# Patient Record
Sex: Female | Born: 1985
Health system: Southern US, Community
[De-identification: ages and names within clinical notes are randomized; demographics above are authoritative.]

## PROBLEM LIST (undated history)

## (undated) ENCOUNTER — Inpatient Hospital Stay (HOSPITAL_COMMUNITY): Payer: Self-pay

## (undated) DIAGNOSIS — F32A Depression, unspecified: Secondary | ICD-10-CM

## (undated) DIAGNOSIS — Z8619 Personal history of other infectious and parasitic diseases: Secondary | ICD-10-CM

## (undated) DIAGNOSIS — F909 Attention-deficit hyperactivity disorder, unspecified type: Secondary | ICD-10-CM

## (undated) DIAGNOSIS — M1990 Unspecified osteoarthritis, unspecified site: Secondary | ICD-10-CM

## (undated) DIAGNOSIS — M5430 Sciatica, unspecified side: Secondary | ICD-10-CM

## (undated) DIAGNOSIS — O039 Complete or unspecified spontaneous abortion without complication: Secondary | ICD-10-CM

## (undated) DIAGNOSIS — T560X1A Toxic effect of lead and its compounds, accidental (unintentional), initial encounter: Secondary | ICD-10-CM

## (undated) DIAGNOSIS — R87629 Unspecified abnormal cytological findings in specimens from vagina: Secondary | ICD-10-CM

## (undated) HISTORY — DX: Attention-deficit hyperactivity disorder, unspecified type: F90.9

## (undated) HISTORY — PX: CERVICAL BIOPSY: SHX590

## (undated) HISTORY — DX: Personal history of other infectious and parasitic diseases: Z86.19

## (undated) HISTORY — PX: COLPOSCOPY: SHX161

## (undated) HISTORY — DX: Depression, unspecified: F32.A

## (undated) HISTORY — PX: WISDOM TOOTH EXTRACTION: SHX21

---

## 2017-01-04 ENCOUNTER — Other Ambulatory Visit: Payer: Self-pay

## 2017-01-04 ENCOUNTER — Emergency Department (HOSPITAL_COMMUNITY): Payer: Medicaid Other

## 2017-01-04 ENCOUNTER — Encounter (HOSPITAL_COMMUNITY): Payer: Self-pay | Admitting: Emergency Medicine

## 2017-01-04 ENCOUNTER — Emergency Department (HOSPITAL_COMMUNITY)
Admission: EM | Admit: 2017-01-04 | Discharge: 2017-01-04 | Discharge status: Against Medical Advice | Payer: Medicaid Other | Attending: Emergency Medicine | Admitting: Emergency Medicine

## 2017-01-04 DIAGNOSIS — F17210 Nicotine dependence, cigarettes, uncomplicated: Secondary | ICD-10-CM | POA: Diagnosis not present

## 2017-01-04 DIAGNOSIS — R1013 Epigastric pain: Secondary | ICD-10-CM | POA: Diagnosis present

## 2017-01-04 DIAGNOSIS — R112 Nausea with vomiting, unspecified: Secondary | ICD-10-CM | POA: Insufficient documentation

## 2017-01-04 DIAGNOSIS — Z3A01 Less than 8 weeks gestation of pregnancy: Secondary | ICD-10-CM | POA: Insufficient documentation

## 2017-01-04 DIAGNOSIS — O9989 Other specified diseases and conditions complicating pregnancy, childbirth and the puerperium: Secondary | ICD-10-CM | POA: Insufficient documentation

## 2017-01-04 DIAGNOSIS — O99331 Smoking (tobacco) complicating pregnancy, first trimester: Secondary | ICD-10-CM | POA: Insufficient documentation

## 2017-01-04 HISTORY — DX: Complete or unspecified spontaneous abortion without complication: O03.9

## 2017-01-04 LAB — CBC
HCT: 38.6 % (ref 36.0–46.0)
Hemoglobin: 13.3 g/dL (ref 12.0–15.0)
MCH: 31.3 pg (ref 26.0–34.0)
MCHC: 34.5 g/dL (ref 30.0–36.0)
MCV: 90.8 fL (ref 78.0–100.0)
Platelets: 272 10*3/uL (ref 150–400)
RBC: 4.25 MIL/uL (ref 3.87–5.11)
RDW: 12.7 % (ref 11.5–15.5)
WBC: 12.6 10*3/uL — ABNORMAL HIGH (ref 4.0–10.5)

## 2017-01-04 LAB — COMPREHENSIVE METABOLIC PANEL
ALT: 33 U/L (ref 14–54)
AST: 28 U/L (ref 15–41)
Albumin: 4.1 g/dL (ref 3.5–5.0)
Alkaline Phosphatase: 64 U/L (ref 38–126)
Anion gap: 10 (ref 5–15)
BUN: 6 mg/dL (ref 6–20)
CO2: 21 mmol/L — ABNORMAL LOW (ref 22–32)
Calcium: 9.3 mg/dL (ref 8.9–10.3)
Chloride: 102 mmol/L (ref 101–111)
Creatinine, Ser: 0.47 mg/dL (ref 0.44–1.00)
GFR calc Af Amer: >60 mL/min (ref >60)
GFR calc non Af Amer: >60 mL/min (ref >60)
Glucose, Bld: 118 mg/dL — ABNORMAL HIGH (ref 65–99)
Potassium: 3.6 mmol/L (ref 3.5–5.1)
Sodium: 133 mmol/L — ABNORMAL LOW (ref 135–145)
Total Bilirubin: 0.9 mg/dL (ref 0.3–1.2)
Total Protein: 7.2 g/dL (ref 6.5–8.1)

## 2017-01-04 LAB — URINALYSIS, ROUTINE W REFLEX MICROSCOPIC
Bacteria, UA: NONE SEEN
Glucose, UA: NEGATIVE mg/dL
Hgb urine dipstick: NEGATIVE
Ketones, ur: 20 mg/dL — AB
Nitrite: NEGATIVE
Protein, ur: 100 mg/dL — AB
Specific Gravity, Urine: 1.025 (ref 1.005–1.030)
pH: 7 (ref 5.0–8.0)

## 2017-01-04 LAB — I-STAT BETA HCG BLOOD, ED (MC, WL, AP ONLY): I-stat hCG, quantitative: >2000 m[IU]/mL — ABNORMAL HIGH (ref <5)

## 2017-01-04 LAB — HCG, QUANTITATIVE, PREGNANCY: hCG, Beta Chain, Quant, S: 159467 m[IU]/mL — ABNORMAL HIGH (ref <5)

## 2017-01-04 LAB — LIPASE, BLOOD: Lipase: 18 U/L (ref 11–51)

## 2017-01-04 MED ORDER — SODIUM CHLORIDE 0.9 % IV BOLUS (SEPSIS)
1000.0000 mL | Freq: Once | INTRAVENOUS | Status: AC
  Administered 2017-01-04: 1000 mL via INTRAVENOUS

## 2017-01-04 MED ORDER — DIPHENHYDRAMINE HCL 25 MG PO CAPS: 25.0000 mg | ORAL_CAPSULE | Freq: Once | ORAL | Status: DC

## 2017-01-04 MED ORDER — METOCLOPRAMIDE HCL 5 MG/ML IJ SOLN
10.0000 mg | Freq: Once | INTRAMUSCULAR | Status: AC
  Administered 2017-01-04: 10 mg via INTRAVENOUS
  Filled 2017-01-04: qty 2

## 2017-01-04 MED ORDER — GI COCKTAIL ~~LOC~~: 30.0000 mL | Freq: Once | ORAL | Status: DC

## 2017-01-04 MED ORDER — ALUM & MAG HYDROXIDE-SIMETH 200-200-20 MG/5ML PO SUSP: 15.0000 mL | Freq: Once | ORAL | Status: DC

## 2017-01-04 MED ORDER — LIDOCAINE VISCOUS 2 % MT SOLN: 15.0000 mL | Freq: Once | OROMUCOSAL | Status: DC

## 2017-01-04 NOTE — ED Provider Notes (Signed)
[facility]ENC[facility] Provider Note   CSN: [id number] Arrival date & time: 01/04/17  1106     History   Chief Complaint Chief Complaint  Patient presents with  . Abdominal Pain  . Emesis During Pregnancy    HPI Bonnie Mccoy is a 31 y.o. female.  HPI  Patient is a 31 year old female who is currently 4-[redacted] weeks pregnant presenting for epigastric pain for 24 hours.  Patient reports additionally she has had nausea and emesis approximately 15 times in the past 24 hours.  No hematemesis.  Patient reports that her pain began after she ate a hashbrown meal.  Patient reports that pain is worsening becoming more constant, sharp in nature, and radiating into the back.  Last bowel movement 2 days ago and normal per patient.  No melena or hematochezia.  No fevers or chills.  No remedies tried for symptoms.  Patient reports she has had some increased vaginal discharge, but no vaginal bleeding.  NO RLQ or LLQ tenderness. Patient reports she has no sexual partners at present.  Patient drinks probably one beer daily.  Patient does not take nonsteroidal anti-inflammatory's.  Patient is currently in a alcohol treatment facility on outpatient basis.  Patient reports that she is wishing to terminate the pregnancy, however she has not made any appointments with obstetrics to begin this process.  Past Medical History:  Diagnosis Date  . Abortion     There are no active problems to display for this patient.   History reviewed. No pertinent surgical history.  OB History    Gravida Para Term Preterm AB Living   1             SAB TAB Ectopic Multiple Live Births                   Home Medications    Prior to Admission medications   Not on File    Family History History reviewed. No pertinent family history.  Social History Social History   Tobacco Use  . Smoking status: Current Every Day Smoker    Types: Cigarettes  . Smokeless tobacco: Never Used    Substance Use Topics  . Alcohol use: Yes  . Drug use: Yes    Types: Marijuana    Comment: former cocaine user     Allergies   Bee venom and Shellfish allergy   Review of Systems Review of Systems  Constitutional: Negative for chills and fever.  HENT: Negative for sore throat.   Respiratory: Negative for cough, chest tightness and shortness of breath.   Cardiovascular: Negative for chest pain.  Gastrointestinal: Positive for abdominal pain, nausea and vomiting. Negative for constipation and diarrhea.  Genitourinary: Positive for vaginal discharge. Negative for dysuria, flank pain and vaginal bleeding.  Musculoskeletal: Negative for back pain.  Neurological: Negative for dizziness, syncope, light-headedness and headaches.     Physical Exam Updated Vital Signs BP 127/74 (BP Location: Right Arm)   Pulse 77   Temp 98.3 F (36.8 C) (Oral)   Resp 16   LMP 11/19/2016   SpO2 99%   Physical Exam  Constitutional: She appears well-developed and well-nourished. No distress.  HENT:  Head: Normocephalic and atraumatic.  Mouth/Throat: Oropharynx is clear and moist.  Eyes: Conjunctivae and EOM are normal. Pupils are equal, round, and reactive to light.  Neck: Normal range of motion. Neck supple.  Cardiovascular: Normal rate, regular rhythm, S1 normal and S2 normal.  No murmur heard. Pulmonary/Chest: Effort normal and  breath sounds normal. She has no wheezes. She has no rales.  Abdominal: Soft. She exhibits no distension. There is tenderness. There is no guarding.  Tenderness to palpation of the epigastric and right upper quadrant.  Negative Murphy sign.  Mild suprapubic tenderness.  No right lower or left lower quadrant tenderness.  No rebound.  Musculoskeletal: Normal range of motion. She exhibits no edema or deformity.  Lymphadenopathy:    She has no cervical adenopathy.  Neurological: She is alert.  Patient moves extremities symmetrically and with good coordination.Cranial  nerves grossly intact.  Skin: Skin is warm and dry. No rash noted. No erythema.  Psychiatric: She has a normal mood and affect. Her behavior is normal. Judgment and thought content normal.  Nursing note and vitals reviewed.    ED Treatments / Results  Labs (all labs ordered are listed, but only abnormal results are displayed) Labs Reviewed  COMPREHENSIVE METABOLIC PANEL - Abnormal; Notable for the following components:      Result Value   Sodium 133 (*)    CO2 21 (*)    Glucose, Bld 118 (*)    All other components within normal limits  CBC - Abnormal; Notable for the following components:   WBC 12.6 (*)    All other components within normal limits  I-STAT BETA HCG BLOOD, ED ([facility], [facility], [facility] ONLY) - Abnormal; Notable for the following components:   I-stat hCG, quantitative >2,000.0 (*)    All other components within normal limits  WET PREP, GENITAL  LIPASE, BLOOD  URINALYSIS, ROUTINE W REFLEX MICROSCOPIC  HCG, QUANTITATIVE, PREGNANCY  RPR  HIV ANTIBODY (ROUTINE TESTING)  GC/CHLAMYDIA PROBE AMP ([place]) NOT AT [facility]    EKG  EKG Interpretation None       Radiology No results found.  Procedures Procedures (including critical care time)  Medications Ordered in ED Medications  sodium chloride 0.9 % bolus 1,000 mL (1,000 mLs Intravenous New Bag/Given 01/04/17 1312)  metoCLOPramide (REGLAN) injection 10 mg (10 mg Intravenous Given 01/04/17 1312)     Initial Impression / Assessment and Plan / ED Course  I have reviewed the triage vital signs and the nursing notes.  Pertinent labs & imaging results that were available during my care of the patient were reviewed by me and considered in my medical decision making (see chart for details).     Final Clinical Impressions(s) / ED Diagnoses   Final diagnoses:  Epigastric pain   Patient is nontoxic-appearing and in no acute distress.  Differential diagnosis includes cholecystitis, peptic ulcer disease, gastritis,  dyspepsia, pelvic inflammatory disease with Fitz-Hugh Curtis syndrome, ectopic pregnancy.  I discussed with patient recommend a workup, including right upper quadrant ultrasound, pelvic ultrasound to rule out ectopic pregnancy, as well as pelvic exam and STI testing due to historical features vaginal discharge.  Reglan for nausea as well as Maalox and viscous lidocaine.   While in the ultrasound department, patient became significantly anxious and I spoke with the ultrasound tech regarding patient's anxiety.  Patient was evaluated in ultrasound.  Patient was stating that she was feeling better and could not stay any longer.  Patient reports that she was anxious and needed to smoke a cigarette.  Patient reported feeling claustrophobic in the ultrasound department.  I suspect anxiety may be Reglan related.  I offered Benadryl as well as other antianxiety medications, but patient refused these therapies.  Patient denied any dissatisfaction with her care, just stated that she "feels that I have to go."  I  discussed with patient the risks of leaving including not having urine testing back, and not having the radiology read on her right upper quadrant ultrasound.  Patient had not received pelvic ultrasound at that time that she decided to leave.  Patient verbalized that she understood these risks.  Patient did agree to return if she had worsening pain or symptoms, or had any vaginal bleeding.  Patient was understanding that she was leaving AGAINST MEDICAL ADVICE.  I feel that she has a competency and capacity to do this.  Subsequent to patient leaving AMA, right upper quadrant ultrasound radiology read demonstrated no evidence of cholecystitis.  Feel that patient's trace leukocyte finding is likely contamination, as patient has no WBCs or bacteria noted on her UA to suggest infection.  This is a supervised visit with Dr. Crista Curbana Liu. Evaluation, management, and discharge planning discussed with this attending  physician.  ED Discharge Orders    None       Delia ChimesMurray, Joyous Gleghorn B, PA-C 01/04/17 1614    Lavera GuiseLiu, Dana Duo, MD 01/04/17 [phone]

## 2017-01-04 NOTE — ED Notes (Addendum)
US tech called this RN to let us know the patient refused her ultrasounds. Pt upon returning to ED wanted to emergently leave. Attempted reassuring patient and providing benedryl but pt just wanted to leave and signed paper AMA. Dr. Verdie MosherLiu and Aviva KluverAlyssa Murray, PA-C notified.

## 2017-01-04 NOTE — ED Triage Notes (Signed)
Pt states she just found out she is pregnant- approx [redacted] weeks pregnant. Pt states she has been vomiting all night; complaining of abd pain. Hx of 2 abortions. Pt states she does not want to be pregnant. Last period 11/19/16.

## 2017-01-05 ENCOUNTER — Inpatient Hospital Stay (HOSPITAL_COMMUNITY): Payer: Medicaid Other

## 2017-01-05 ENCOUNTER — Inpatient Hospital Stay (HOSPITAL_COMMUNITY)
Admission: AD | Admit: 2017-01-05 | Discharge: 2017-01-05 | Disposition: A | Discharge status: Home / Self Care | Payer: Medicaid Other | Source: Ambulatory Visit | Attending: Family Medicine | Admitting: Family Medicine

## 2017-01-05 ENCOUNTER — Telehealth: Payer: Self-pay | Admitting: Obstetrics and Gynecology

## 2017-01-05 ENCOUNTER — Encounter (HOSPITAL_COMMUNITY): Payer: Self-pay | Admitting: *Deleted

## 2017-01-05 DIAGNOSIS — R112 Nausea with vomiting, unspecified: Secondary | ICD-10-CM | POA: Insufficient documentation

## 2017-01-05 DIAGNOSIS — Z3A01 Less than 8 weeks gestation of pregnancy: Secondary | ICD-10-CM | POA: Insufficient documentation

## 2017-01-05 DIAGNOSIS — R109 Unspecified abdominal pain: Secondary | ICD-10-CM

## 2017-01-05 DIAGNOSIS — O30041 Twin pregnancy, dichorionic/diamniotic, first trimester: Secondary | ICD-10-CM | POA: Diagnosis not present

## 2017-01-05 DIAGNOSIS — R102 Pelvic and perineal pain: Secondary | ICD-10-CM | POA: Insufficient documentation

## 2017-01-05 DIAGNOSIS — O26891 Other specified pregnancy related conditions, first trimester: Secondary | ICD-10-CM | POA: Diagnosis not present

## 2017-01-05 DIAGNOSIS — Z590 Homelessness: Secondary | ICD-10-CM | POA: Diagnosis not present

## 2017-01-05 DIAGNOSIS — F17210 Nicotine dependence, cigarettes, uncomplicated: Secondary | ICD-10-CM | POA: Diagnosis not present

## 2017-01-05 DIAGNOSIS — R1030 Lower abdominal pain, unspecified: Secondary | ICD-10-CM | POA: Diagnosis not present

## 2017-01-05 DIAGNOSIS — Z9889 Other specified postprocedural states: Secondary | ICD-10-CM | POA: Insufficient documentation

## 2017-01-05 DIAGNOSIS — Z3491 Encounter for supervision of normal pregnancy, unspecified, first trimester: Secondary | ICD-10-CM

## 2017-01-05 DIAGNOSIS — Z91013 Allergy to seafood: Secondary | ICD-10-CM | POA: Diagnosis not present

## 2017-01-05 DIAGNOSIS — Z79899 Other long term (current) drug therapy: Secondary | ICD-10-CM | POA: Insufficient documentation

## 2017-01-05 DIAGNOSIS — Z91030 Bee allergy status: Secondary | ICD-10-CM | POA: Insufficient documentation

## 2017-01-05 DIAGNOSIS — O99331 Smoking (tobacco) complicating pregnancy, first trimester: Secondary | ICD-10-CM | POA: Insufficient documentation

## 2017-01-05 HISTORY — DX: Unspecified osteoarthritis, unspecified site: M19.90

## 2017-01-05 HISTORY — DX: Unspecified abnormal cytological findings in specimens from vagina: R87.629

## 2017-01-05 HISTORY — DX: Toxic effect of lead and its compounds, accidental (unintentional), initial encounter: T56.0X1A

## 2017-01-05 HISTORY — DX: Sciatica, unspecified side: M54.30

## 2017-01-05 LAB — HIV ANTIBODY (ROUTINE TESTING W REFLEX): HIV Screen 4th Generation wRfx: NONREACTIVE

## 2017-01-05 LAB — RPR: RPR Ser Ql: NONREACTIVE

## 2017-01-05 LAB — WET PREP, GENITAL
Sperm: NONE SEEN
Trich, Wet Prep: NONE SEEN
Yeast Wet Prep HPF POC: NONE SEEN

## 2017-01-05 NOTE — Telephone Encounter (Signed)
TC encounter entered on this patient in error.  Bonnie Mccoy, CNM 01/05/2017 12:14 PM

## 2017-01-05 NOTE — MAU Provider Note (Signed)
History     CSN: [id number]  Arrival date and time: 01/05/17 1126   First Provider Initiated Contact with Patient 01/05/17 1217     Chief Complaint  Patient presents with  . Abdominal Pain  . Nausea  . Emesis   HPI Bonnie Mccoy is a 31 y.o. [id number] at 1968w5d who presents with lower abdominal pain. She [facility] labs and an upper abdominal ultrasound but left AMA after having a reaction to reglan so she did not have her ultrasound to evaluate the pregnancy. She states she has continued to have the lower abdominal pain that she rates a 6/10 and has not tried any medication for the pain. She denies any vaginal bleeding or discharge. This is not a desired pregnancy. The patient reports being homeless with 3 of her children and in an assistance program.   OB History    Gravida Para Term Preterm AB Living   7 4 3 1 2 4    SAB TAB Ectopic Multiple Live Births     2            Past Medical History:  Diagnosis Date  . Abortion   . Arthritis   . Lead poisoning   . Sciatica   . Vaginal Pap smear, abnormal     Past Surgical History:  Procedure Laterality Date  . CERVICAL BIOPSY    . WISDOM TOOTH EXTRACTION      History reviewed. No pertinent family history.  Social History   Tobacco Use  . Smoking status: Current Every Day Smoker    Packs/day: 0.50    Types: Cigarettes  . Smokeless tobacco: Never Used  Substance Use Topics  . Alcohol use: Yes  . Drug use: Yes    Types: Marijuana    Comment: Last marijuana use 01/05/17    Allergies:  Allergies  Allergen Reactions  . Bee Venom     Anaphylaxis    . Shellfish Allergy     anaphylaxis     Medications Prior to Admission  Medication Sig Dispense Refill Last Dose  . EPINEPHrine 0.3 mg/0.3 mL IJ SOAJ injection INJECT INTO THE MUSCLE AS DIRECTED BY DOCTOR  0 emergency    Review of Systems  Constitutional: Negative.  Negative for fatigue and fever.  HENT: Negative.    Respiratory: Negative.  Negative for shortness of breath.   Cardiovascular: Negative.  Negative for chest pain.  Gastrointestinal: Positive for abdominal pain. Negative for constipation, diarrhea, nausea and vomiting.  Genitourinary: Negative.  Negative for dysuria, vaginal bleeding and vaginal discharge.  Neurological: Negative.  Negative for dizziness and headaches.   Physical Exam   Blood pressure 115/62, pulse 68, temperature 97.8 F (36.6 C), temperature source Oral, resp. rate 18, height 5' 4.5" (1.638 m), weight 180 lb (81.6 kg), last menstrual period 11/19/2016.  Physical Exam  Nursing note and vitals reviewed. Constitutional: She is oriented to person, place, and time. She appears well-developed and well-nourished. No distress.  HENT:  Head: Normocephalic.  Eyes: Pupils are equal, round, and reactive to light.  Cardiovascular: Normal rate, regular rhythm and normal heart sounds.  Respiratory: Effort normal and breath sounds normal. No respiratory distress.  GI: Soft. Bowel sounds are normal. She exhibits no distension and no mass. There is no tenderness. There is no guarding.  Neurological: She is alert and oriented to person, place, and time.  Skin: Skin is warm and dry.  Psychiatric: She has a normal mood and affect.  Her behavior is normal. Judgment and thought content normal.    MAU Course  Procedures Results for orders placed or performed during the hospital encounter of 01/05/17 (from the past 24 hour(s))  Wet prep, genital     Status: Abnormal   Collection Time: 01/05/17 12:31 PM  Result Value Ref Range   Yeast Wet Prep HPF POC NONE SEEN NONE SEEN   Trich, Wet Prep NONE SEEN NONE SEEN   Clue Cells Wet Prep HPF POC PRESENT (A) NONE SEEN   WBC, Wet Prep HPF POC MODERATE (A) NONE SEEN   Sperm NONE SEEN     MDM Wet prep and gc/chlamydia- wet prep indicated bacterial vaginosis but patient has no symptoms and declines treatment US OB Transvaginal US OB Comp Less 14  weeks- di/di twin gestation measuring 2522w1d with baby A FHR 142 and baby B HR 140  Assessment and Plan   1. Normal IUP (intrauterine pregnancy) on prenatal ultrasound, first trimester   2. Lower abdominal pain   3. Dichorionic diamniotic twin pregnancy in first trimester   4. Abdominal pain during pregnancy in first trimester    -Discharge home in stable condition -Abdominal pain and bleeding precautions discussed -Patient advised to follow-up with OB/GYN provider of choice -Patient may return to MAU as needed or if her condition were to change or worsen  Rolm BookbinderCaroline M Britany Callicott CNM 01/05/2017, 12:31 PM

## 2017-01-05 NOTE — MAU Note (Signed)
[facility], had U/S for upper abd pain, received med in ED that "increased my anxiety", left before discharge.  Was also told in ED she could possibly have ectopic pregnancy, but left before this was evaluated.  Pt states she continues to have upper abd pain & now lower abd pain which is new.  Denies bleeding.  Continues to have nausea & vomiting, no diarrhea.

## 2017-01-08 LAB — GC/CHLAMYDIA PROBE AMP (~~LOC~~) NOT AT ARMC
Chlamydia: NEGATIVE
Neisseria Gonorrhea: POSITIVE — AB

## 2017-07-31 ENCOUNTER — Emergency Department (HOSPITAL_COMMUNITY)
Admission: EM | Admit: 2017-07-31 | Discharge: 2017-07-31 | Disposition: A | Discharge status: Home / Self Care | Payer: Medicaid Other | Attending: Emergency Medicine | Admitting: Emergency Medicine

## 2017-07-31 ENCOUNTER — Encounter (HOSPITAL_COMMUNITY): Payer: Self-pay | Admitting: Emergency Medicine

## 2017-07-31 DIAGNOSIS — M545 Low back pain: Secondary | ICD-10-CM | POA: Diagnosis not present

## 2017-07-31 DIAGNOSIS — Y939 Activity, unspecified: Secondary | ICD-10-CM | POA: Insufficient documentation

## 2017-07-31 DIAGNOSIS — Y929 Unspecified place or not applicable: Secondary | ICD-10-CM | POA: Diagnosis not present

## 2017-07-31 DIAGNOSIS — Y999 Unspecified external cause status: Secondary | ICD-10-CM | POA: Diagnosis not present

## 2017-07-31 DIAGNOSIS — Z5329 Procedure and treatment not carried out because of patient's decision for other reasons: Secondary | ICD-10-CM | POA: Diagnosis not present

## 2017-07-31 DIAGNOSIS — F17210 Nicotine dependence, cigarettes, uncomplicated: Secondary | ICD-10-CM | POA: Diagnosis not present

## 2017-07-31 DIAGNOSIS — R51 Headache: Secondary | ICD-10-CM | POA: Insufficient documentation

## 2017-07-31 DIAGNOSIS — W19XXXA Unspecified fall, initial encounter: Secondary | ICD-10-CM | POA: Diagnosis not present

## 2017-07-31 DIAGNOSIS — R454 Irritability and anger: Secondary | ICD-10-CM | POA: Insufficient documentation

## 2017-07-31 LAB — URINALYSIS, ROUTINE W REFLEX MICROSCOPIC
Bilirubin Urine: NEGATIVE
Glucose, UA: NEGATIVE mg/dL
Ketones, ur: NEGATIVE mg/dL
Nitrite: NEGATIVE
Protein, ur: NEGATIVE mg/dL
Specific Gravity, Urine: 1.002 — ABNORMAL LOW (ref 1.005–1.030)
WBC, UA: >50 WBC/hpf — ABNORMAL HIGH (ref 0–5)
pH: 6 (ref 5.0–8.0)

## 2017-07-31 LAB — POC URINE PREG, ED: Preg Test, Ur: NEGATIVE

## 2017-07-31 MED ORDER — METHOCARBAMOL 500 MG PO TABS
750.0000 mg | ORAL_TABLET | Freq: Once | ORAL | Status: DC
  Filled 2017-07-31: qty 2

## 2017-07-31 MED ORDER — KETOROLAC TROMETHAMINE 60 MG/2ML IM SOLN
30.0000 mg | Freq: Once | INTRAMUSCULAR | Status: DC
  Filled 2017-07-31: qty 2

## 2017-07-31 NOTE — ED Notes (Signed)
This RN entered pt room to administer pain medicine. Writer discussed medications with pt, pt stated she didn't want any needles. Writer stated pt has right to refuse any medications. Pt asked what test were ordered and "why are y'all giving me pain med's without figuring out what's wrong first. Writer asked pt about her symptoms and stated I would ask her PA about testing, pt began raising her voice at Clinical research associatewriter. Writer politely asked pt to lower her voice. Pt began to yell at writer, stated "I'm yelling because I'm in pain and you should be a professional and understand that". Writer stated I would step out and speak with the PA regarding her care. Pt began to yell louder. Writer stepped out and called for security and Press photographercharge nurse.

## 2017-07-31 NOTE — ED Triage Notes (Signed)
Patient reports unwitnessed fall last night hitting head on shower rod. Denies LOC. C/o headache and vomiting since the time of fall. Reports left flank pain and generalized body aches.

## 2017-07-31 NOTE — ED Provider Notes (Signed)
I went to see the patient.  Patient was cursing at our nurses.  Was dressing herself and stating that she wants to leave.  I apologized to the patient for the delay, informed her that the APP had already seen her, and that on the supervising physician.  I asked her if she would like to speak with me about her complaints so that we can figure out if she needs further testing.  Patient continued to curse (F bombs and Mother F bombs), stating that we are not giving her any answers and that she does not want any Tylenol. She then started walking out, I asked her one more time if she would prefer that I listen to her and explained to her what my suspicions are.  Patient stated that she is aggravated and frustrated to the point where she wants to just leave and that she rather "die somewhere else". Pt here with a family friend, who was unable to convince patient to stay either.  Patient did not stay back for us to have any meaningful AMA conversation - all I could inform her was that since he did not get any history from her her work-up will be limited.   Derwood KaplanNanavati, Apple Dearmas, MD 07/31/17 1326
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
## 2017-07-31 NOTE — ED Provider Notes (Signed)
[facility]-EMERGENCY DEPT Provider Note   CSN: [id number] Arrival date & time: 07/31/17  1145     History   Chief Complaint Chief Complaint  Patient presents with  . Fall  . Headache    HPI Bonnie Mccoy is a 32 y.o. female.  32 y/o female presents to the ED complaining of headache s/p fall last night via ems. Patient states she has a sharp headache and has vomited x2 today. Patient states she feels like she has a concussion which she had in the past. She stated she landed mainly on her left side but is complaining of pain on the left side. Patient states she feels like "my kidney is being stabbed" .She denies any urinary symptoms, abdominal pain, chest pain or shortness of breath.      Past Medical History:  Diagnosis Date  . Abortion   . Arthritis   . Lead poisoning   . Sciatica   . Vaginal Pap smear, abnormal     There are no active problems to display for this patient.   Past Surgical History:  Procedure Laterality Date  . CERVICAL BIOPSY    . WISDOM TOOTH EXTRACTION       OB History    Gravida  7   Para  4   Term  3   Preterm  1   AB  2   Living  4     SAB      TAB  2   Ectopic      Multiple      Live Births               Home Medications    Prior to Admission medications   Medication Sig Start Date End Date Taking? Authorizing Provider  EPINEPHrine 0.3 mg/0.3 mL IJ SOAJ injection INJECT INTO THE MUSCLE AS DIRECTED BY DOCTOR 11/14/16  Yes [provider]    Family History No family history on file.  Social History Social History   Tobacco Use  . Smoking status: Current Every Day Smoker    Packs/day: 0.50    Types: Cigarettes  . Smokeless tobacco: Never Used  Substance Use Topics  . Alcohol use: Yes  . Drug use: Yes    Types: Marijuana    Comment: Last marijuana use 01/05/17     Allergies   Bee venom and Shellfish allergy   Review of Systems Review of Systems  Constitutional:  Negative for chills.  Cardiovascular: Negative for chest pain and palpitations.  Gastrointestinal: Positive for nausea and vomiting. Negative for abdominal pain and diarrhea.  Genitourinary: Negative for flank pain, hematuria and vaginal discharge.  Musculoskeletal: Positive for myalgias.  Neurological: Negative for light-headedness and headaches.  All other systems reviewed and are negative.    Physical Exam Updated Vital Signs BP 122/64 (BP Location: Left Arm)   Pulse 87   Temp (!) 97.5 F (36.4 C) (Oral)   Resp 16   Ht 5' 4.5" (1.638 m)   LMP 07/11/2017   SpO2 99%   Breastfeeding? Unknown   BMI 30.42 kg/m   Physical Exam  Constitutional: She is oriented to person, place, and time. She appears well-developed and well-nourished.  HENT:  Head: Normocephalic and atraumatic.  Eyes: Right pupil is reactive. Left pupil is reactive.  Cardiovascular: Normal heart sounds.  Pulmonary/Chest: Effort normal and breath sounds normal. She exhibits no tenderness.  Abdominal: Soft. Bowel sounds are normal. There is no tenderness.  Musculoskeletal: She exhibits tenderness. She exhibits  no edema.       Cervical back: Normal.       Thoracic back: Normal.       Lumbar back: She exhibits tenderness and pain. She exhibits no swelling and no edema.       Back:  Paraspinal tenderness to palpation on lower back  Neurological: She is alert and oriented to person, place, and time.  Skin: Skin is warm and dry.  Nursing note and vitals reviewed.    ED Treatments / Results  Labs (all labs ordered are listed, but only abnormal results are displayed) Labs Reviewed - No data to display  EKG None  Radiology No results found.  Procedures Procedures (including critical care time)  Medications Ordered in ED Medications - No data to display   Initial Impression / Assessment and Plan / ED Course  I have reviewed the triage vital signs and the nursing notes.  Pertinent labs & imaging  results that were available during my care of the patient were reviewed by me and considered in my medical decision making (see chart for details).  Clinical Course as of Jul 31 1324  Tue Jul 31, 2017  1321 I went to see the patient.  Patient was cursing at our nurses.  Was dressing herself and stating that she wants to leave.  I apologized to the patient for the delay, informed her that the APP had already seen her, and that on the supervising physician.  I asked her if she would like to speak with me about her complaints so that we can figure out if she needs further testing.  Patient continued to curse (F bombs and Mother F bombs), stating that we are not giving her any answers and that she does not want any Tylenol. She then started walking out, I asked her one more time if she would prefer that I listen to her and explained to her what my suspicions are.  Patient stated that she is aggravated and frustrated to the point where she wants to just leave and that she rather "die somewhere else". Pt here with a family friend, who was unable to convince patient to stay either.  Patient did not stay back for us to have any meaningful AMA conversation - all I could inform her was that since he did not get any history from her her work-up will be limited.   [AN]    Clinical Course User Index [AN] Derwood KaplanNanavati, Ankit, MD    Patient eloped before treatment could be provided.   Final Clinical Impressions(s) / ED Diagnoses   Final diagnoses:  None    ED Discharge Orders    None       Claude MangesSoto, Dayon Witt, PA-C 07/31/17 1350    Derwood KaplanNanavati, Ankit, MD 07/31/17 1719

## 2017-07-31 NOTE — ED Notes (Signed)
Pt left prior to receiving dc papers. Pt refused to sign for discharge.

## 2018-03-29 ENCOUNTER — Other Ambulatory Visit: Payer: Self-pay

## 2018-03-29 ENCOUNTER — Emergency Department (HOSPITAL_COMMUNITY)
Admission: EM | Admit: 2018-03-29 | Discharge: 2018-03-29 | Disposition: A | Discharge status: Home / Self Care | Payer: Medicaid Other | Attending: Emergency Medicine | Admitting: Emergency Medicine

## 2018-03-29 ENCOUNTER — Encounter (HOSPITAL_COMMUNITY): Payer: Self-pay

## 2018-03-29 DIAGNOSIS — R519 Headache, unspecified: Secondary | ICD-10-CM

## 2018-03-29 DIAGNOSIS — R69 Illness, unspecified: Secondary | ICD-10-CM

## 2018-03-29 DIAGNOSIS — J111 Influenza due to unidentified influenza virus with other respiratory manifestations: Secondary | ICD-10-CM | POA: Insufficient documentation

## 2018-03-29 DIAGNOSIS — Z79899 Other long term (current) drug therapy: Secondary | ICD-10-CM | POA: Insufficient documentation

## 2018-03-29 DIAGNOSIS — G500 Trigeminal neuralgia: Secondary | ICD-10-CM | POA: Insufficient documentation

## 2018-03-29 DIAGNOSIS — F17210 Nicotine dependence, cigarettes, uncomplicated: Secondary | ICD-10-CM | POA: Insufficient documentation

## 2018-03-29 DIAGNOSIS — H9202 Otalgia, left ear: Secondary | ICD-10-CM | POA: Diagnosis present

## 2018-03-29 DIAGNOSIS — R51 Headache: Secondary | ICD-10-CM

## 2018-03-29 MED ORDER — ACETAMINOPHEN 500 MG PO TABS
1000.0000 mg | ORAL_TABLET | Freq: Once | ORAL | Status: AC
  Administered 2018-03-29: 1000 mg via ORAL
  Filled 2018-03-29: qty 2

## 2018-03-29 MED ORDER — PROCHLORPERAZINE EDISYLATE 10 MG/2ML IJ SOLN
10.0000 mg | Freq: Once | INTRAMUSCULAR | Status: DC
Start: 2018-03-29 — End: 2018-03-29
  Filled 2018-03-29: qty 2

## 2018-03-29 MED ORDER — CARBAMAZEPINE 200 MG PO TABS: 200.0000 mg | ORAL_TABLET | Freq: Every day | ORAL | 0 refills | Status: DC

## 2018-03-29 MED ORDER — DIPHENHYDRAMINE HCL 25 MG PO CAPS
25.0000 mg | ORAL_CAPSULE | Freq: Once | ORAL | Status: AC
  Administered 2018-03-29: 25 mg via ORAL
  Filled 2018-03-29: qty 1

## 2018-03-29 MED ORDER — DIPHENHYDRAMINE HCL 50 MG/ML IJ SOLN
25.0000 mg | Freq: Once | INTRAMUSCULAR | Status: DC
  Filled 2018-03-29: qty 1

## 2018-03-29 MED ORDER — SODIUM CHLORIDE 0.9 % IV BOLUS
1000.0000 mL | Freq: Once | INTRAVENOUS | Status: AC
  Administered 2018-03-29: 1000 mL via INTRAVENOUS

## 2018-03-29 MED ORDER — PROCHLORPERAZINE MALEATE 5 MG PO TABS
10.0000 mg | ORAL_TABLET | Freq: Once | ORAL | Status: AC
  Administered 2018-03-29: 10 mg via ORAL
  Filled 2018-03-29: qty 2

## 2018-03-29 NOTE — ED Notes (Signed)
RN attempted to give medication, pt began to panic, RN unattempted to redirect. Pt keeps stating "I'm needlephobic!" RN attempting to assist in breathing exercises. EDP notified and at bedside. Will continue to monitor.
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
## 2018-03-29 NOTE — Discharge Instructions (Addendum)
Your left-sided facial pain is likely something called trigeminal neuralgia.  Please take carbamazepine once daily for this.  I would like for you to follow-up with neurology.  You are also experiencing some flulike symptoms, please support your symptoms with ibuprofen, Tylenol, drink plenty of fluids, you can use over-the-counter cough medications.  The symptoms should improve on their own.  Follow-up with your primary care doctor if symptoms or not improving.

## 2018-03-29 NOTE — ED Notes (Signed)
Pt agreeing to continue IVF at this time. Will continue to monitor.

## 2018-03-29 NOTE — ED Notes (Signed)
Patient verbalizes understanding of discharge instructions. Opportunity for questioning and answers were provided. Armband removed by staff, pt discharged from ED. Pt ambulatory to lobby.  
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
## 2018-10-17 ENCOUNTER — Emergency Department (HOSPITAL_COMMUNITY)
Admission: EM | Admit: 2018-10-17 | Discharge: 2018-10-17 | Disposition: A | Discharge status: Home / Self Care | Payer: Medicaid Other | Attending: Pediatric Emergency Medicine | Admitting: Pediatric Emergency Medicine

## 2018-10-17 ENCOUNTER — Other Ambulatory Visit: Payer: Self-pay

## 2018-10-17 DIAGNOSIS — Z203 Contact with and (suspected) exposure to rabies: Secondary | ICD-10-CM | POA: Insufficient documentation

## 2018-10-17 DIAGNOSIS — F17210 Nicotine dependence, cigarettes, uncomplicated: Secondary | ICD-10-CM | POA: Diagnosis not present

## 2018-10-17 DIAGNOSIS — Z23 Encounter for immunization: Secondary | ICD-10-CM | POA: Insufficient documentation

## 2018-10-17 MED ORDER — EPINEPHRINE 0.3 MG/0.3ML IJ SOAJ: 0.3000 mg | INTRAMUSCULAR | 0 refills | Status: DC | PRN

## 2018-10-17 MED ORDER — RABIES VACCINE, PCEC IM SUSR
1.0000 mL | Freq: Once | INTRAMUSCULAR | Status: AC
  Administered 2018-10-17: 1 mL via INTRAMUSCULAR
  Filled 2018-10-17: qty 1

## 2018-10-17 MED ORDER — RABIES IMMUNE GLOBULIN 150 UNIT/ML IM INJ
20.0000 [IU]/kg | INJECTION | Freq: Once | INTRAMUSCULAR | Status: AC
  Administered 2018-10-17: 1350 [IU] via INTRAMUSCULAR
  Filled 2018-10-17: qty 9

## 2018-10-17 NOTE — ED Provider Notes (Signed)
[facility] EMERGENCY DEPARTMENT Provider Note   CSN: [id number] Arrival date & time: 10/17/18  1849     History   Chief Complaint Chief Complaint  Patient presents with  . Need for Rabies Vaccine    HPI Bonnie Mccoy is a 33 y.o. female.     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  Needs rabies vaccine.  No fever cough or other sick symptoms.  Past Medical History:  Diagnosis Date  . Abortion   . Arthritis   . Lead poisoning   . Sciatica   . Vaginal Pap smear, abnormal     There are no active problems to display for this patient.   Past Surgical History:  Procedure Laterality Date  . CERVICAL BIOPSY    . WISDOM TOOTH EXTRACTION       OB History    Gravida  7   Para  4   Term  3   Preterm  1   AB  2   Living  4     SAB      TAB  2   Ectopic      Multiple      Live Births               Home Medications    Prior to Admission medications   Medication Sig Start Date End Date Taking? Authorizing Provider  EPINEPHrine 0.3 mg/0.3 mL IJ SOAJ injection Inject 0.3 mLs (0.3 mg total) into the muscle as needed for anaphylaxis. 10/17/18   Charlett Nose, MD    Family History No family history on file.  Social History Social History   Tobacco Use  . Smoking status: Current Every Day Smoker    Packs/day: 0.50    Types: Cigarettes  . Smokeless tobacco: Never Used  Substance Use Topics  . Alcohol use: Yes  . Drug use: Yes    Types: Marijuana    Comment: Last marijuana use 01/05/17     Allergies   Bee venom and Shellfish allergy   Review of Systems Review of Systems  Constitutional: Negative for chills and fever.  HENT: Negative for ear pain and sore throat.   Eyes: Negative for pain and visual disturbance.  Respiratory: Negative for cough and shortness of breath.   Cardiovascular: Negative for chest  pain and palpitations.  Gastrointestinal: Negative for abdominal pain and vomiting.  Genitourinary: Negative for dysuria and hematuria.  Musculoskeletal: Negative for arthralgias and back pain.  Skin: Negative for color change and rash.  Neurological: Negative for seizures and syncope.  All other systems reviewed and are negative.    Physical Exam Updated Vital Signs BP (!) 159/128 (BP Location: Right Arm)   Pulse 74   Temp 98.3 F (36.8 C)   Resp 18   Wt 68.7 kg   SpO2 100%   BMI 25.60 kg/m   Physical Exam Vitals signs and nursing note reviewed.  Constitutional:      General: She is not in acute distress.    Appearance: She is well-developed.  HENT:     Head: Normocephalic and atraumatic.  Eyes:     Conjunctiva/sclera: Conjunctivae normal.  Neck:     Musculoskeletal: Neck supple.  Cardiovascular:     Rate and Rhythm: Normal rate and regular rhythm.     Heart sounds: No murmur.  Pulmonary:     Effort: Pulmonary  effort is normal. No respiratory distress.     Breath sounds: Normal breath sounds.  Abdominal:     Palpations: Abdomen is soft.     Tenderness: There is no abdominal tenderness.  Skin:    General: Skin is warm and dry.  Neurological:     Mental Status: She is alert.      ED Treatments / Results  Labs (all labs ordered are listed, but only abnormal results are displayed) Labs Reviewed - No data to display  EKG None  Radiology No results found.  Procedures Procedures (including critical care time)  Medications Ordered in ED Medications  rabies vaccine (RABAVERT) injection 1 mL (has no administration in time range)  rabies immune globulin (HYPERAB/KEDRAB) injection 1,350 Units (has no administration in time range)     Initial Impression / Assessment and Plan / ED Course  I have reviewed the triage vital signs and the nursing notes.  Pertinent labs & imaging results that were available during my care of the patient were reviewed by me and  considered in my medical decision making (see chart for details).        33 year old female with possible rabbit animal exposure.  No scratches.  Normal neurologic exam.  Hemodynamically appropriate and stable on room air with normal saturations.  Lungs clear with good air entry.  Normal cardiac exam.  Benign abdomen.  Will provide immunoglobulin and vaccine here.  Also provided prescription for epinephrine as patient noted she was out.  Return precautions including vaccine schedule discussed with mom and family prior to discharge and they were advised to follow with pcp as needed if symptoms worsen or fail to improve.   Final Clinical Impressions(s) / ED Diagnoses   Final diagnoses:  Need for immunization against rabies    ED Discharge Orders         Ordered    EPINEPHrine 0.3 mg/0.3 mL IJ SOAJ injection  As needed     10/17/18 1945           Brent Bulla, MD 10/17/18 2014

## 2018-10-17 NOTE — ED Notes (Signed)
Mom acting appropriately in room, no symptoms or complaints voiced. Breathing even and unlabored.

## 2018-10-17 NOTE — ED Triage Notes (Signed)
Recent contact with rapid cat.  Need for rabies vaccination.

## 2018-10-24 ENCOUNTER — Encounter (HOSPITAL_COMMUNITY): Payer: Self-pay | Admitting: *Deleted

## 2018-10-24 ENCOUNTER — Other Ambulatory Visit: Payer: Self-pay

## 2018-10-24 ENCOUNTER — Emergency Department (HOSPITAL_COMMUNITY)
Admission: EM | Admit: 2018-10-24 | Discharge: 2018-10-24 | Disposition: A | Discharge status: Home / Self Care | Payer: Medicaid Other | Attending: Emergency Medicine | Admitting: Emergency Medicine

## 2018-10-24 DIAGNOSIS — Z203 Contact with and (suspected) exposure to rabies: Secondary | ICD-10-CM | POA: Insufficient documentation

## 2018-10-24 DIAGNOSIS — Z23 Encounter for immunization: Secondary | ICD-10-CM | POA: Diagnosis not present

## 2018-10-24 MED ORDER — RABIES VACCINE, PCEC IM SUSR
1.0000 mL | Freq: Once | INTRAMUSCULAR | Status: AC
  Administered 2018-10-24: 1 mL via INTRAMUSCULAR
  Filled 2018-10-24: qty 1

## 2018-10-24 NOTE — ED Triage Notes (Signed)
Presents to [facility] with rest of family with need of day 7 rabies vaccination.

## 2018-10-24 NOTE — ED Provider Notes (Signed)
[facility] EMERGENCY DEPARTMENT Provider Note   CSN: [id number] Arrival date & time: 10/24/18  1807     History   Chief Complaint Chief Complaint  Patient presents with   Rabies Injection    HPI Bonnie Mccoy is a 33 y.o. female.     33 year old who presents for rabies vaccination.  No complications since last injection 1 week ago.  No fevers, no pain, no neurologic symptoms.  The history is provided by the patient. No language interpreter was used.    Past Medical History:  Diagnosis Date   Abortion    Arthritis    Lead poisoning    Sciatica    Vaginal Pap smear, abnormal     There are no active problems to display for this patient.   Past Surgical History:  Procedure Laterality Date   CERVICAL BIOPSY     WISDOM TOOTH EXTRACTION       OB History    Gravida  7   Para  4   Term  3   Preterm  1   AB  2   Living  4     SAB      TAB  2   Ectopic      Multiple      Live Births               Home Medications    Prior to Admission medications   Medication Sig Start Date End Date Taking? Authorizing Provider  EPINEPHrine 0.3 mg/0.3 mL IJ SOAJ injection Inject 0.3 mLs (0.3 mg total) into the muscle as needed for anaphylaxis. 10/17/18   Charlett Nose, MD    Family History History reviewed. No pertinent family history.  Social History Social History   Tobacco Use   Smoking status: Current Every Day Smoker    Packs/day: 0.50    Types: Cigarettes   Smokeless tobacco: Never Used  Substance Use Topics   Alcohol use: Yes   Drug use: Yes    Types: Marijuana    Comment: Last marijuana use 01/05/17     Allergies   Bee venom and Shellfish allergy   Review of Systems Review of Systems  All other systems reviewed and are negative.    Physical Exam Updated Vital Signs BP 105/67 (BP Location: Left Arm)    Pulse (!) 106    Temp 98.1 F (36.7 C) (Temporal)    Resp 16    Wt 71 kg    SpO2 100%    BMI  26.45 kg/m   Physical Exam Vitals signs and nursing note reviewed.  Constitutional:      Appearance: She is well-developed.  HENT:     Head: Normocephalic and atraumatic.     Right Ear: External ear normal.     Left Ear: External ear normal.  Eyes:     Conjunctiva/sclera: Conjunctivae normal.  Neck:     Musculoskeletal: Normal range of motion and neck supple.  Cardiovascular:     Rate and Rhythm: Normal rate.     Heart sounds: Normal heart sounds.  Pulmonary:     Effort: Pulmonary effort is normal.     Breath sounds: Normal breath sounds.  Abdominal:     General: Bowel sounds are normal.     Palpations: Abdomen is soft.     Tenderness: There is no abdominal tenderness. There is no rebound.  Musculoskeletal: Normal range of motion.  Skin:    General: Skin is warm.  Neurological:  Mental Status: She is alert and oriented to person, place, and time.      ED Treatments / Results  Labs (all labs ordered are listed, but only abnormal results are displayed) Labs Reviewed - No data to display  EKG None  Radiology No results found.  Procedures Procedures (including critical care time)  Medications Ordered in ED Medications  rabies vaccine (RABAVERT) injection 1 mL (1 mL Intramuscular Given 10/24/18 1956)     Initial Impression / Assessment and Plan / ED Course  I have reviewed the triage vital signs and the nursing notes.  Pertinent labs & imaging results that were available during my care of the patient were reviewed by me and considered in my medical decision making (see chart for details).        33 year old who presents for rabies vaccination.  No complications with last vaccine.  Will give vaccine today.  Will have family return as scheduled.  Final Clinical Impressions(s) / ED Diagnoses   Final diagnoses:  Need for rabies vaccination    ED Discharge Orders    None       Louanne Skye, MD 10/24/18 2058

## 2019-01-13 ENCOUNTER — Encounter (HOSPITAL_COMMUNITY): Payer: Self-pay | Admitting: Obstetrics and Gynecology

## 2019-01-13 ENCOUNTER — Inpatient Hospital Stay (HOSPITAL_COMMUNITY)
Admission: AD | Admit: 2019-01-13 | Discharge: 2019-01-13 | Disposition: A | Discharge status: Home / Self Care | Payer: Medicaid Other | Attending: Obstetrics and Gynecology | Admitting: Obstetrics and Gynecology

## 2019-01-13 ENCOUNTER — Inpatient Hospital Stay (HOSPITAL_COMMUNITY): Payer: Medicaid Other

## 2019-01-13 ENCOUNTER — Other Ambulatory Visit: Payer: Self-pay

## 2019-01-13 DIAGNOSIS — M549 Dorsalgia, unspecified: Secondary | ICD-10-CM | POA: Insufficient documentation

## 2019-01-13 DIAGNOSIS — O26891 Other specified pregnancy related conditions, first trimester: Secondary | ICD-10-CM

## 2019-01-13 DIAGNOSIS — O3680X0 Pregnancy with inconclusive fetal viability, not applicable or unspecified: Secondary | ICD-10-CM | POA: Diagnosis not present

## 2019-01-13 DIAGNOSIS — Z91030 Bee allergy status: Secondary | ICD-10-CM | POA: Insufficient documentation

## 2019-01-13 DIAGNOSIS — Z91013 Allergy to seafood: Secondary | ICD-10-CM | POA: Diagnosis not present

## 2019-01-13 DIAGNOSIS — O99891 Other specified diseases and conditions complicating pregnancy: Secondary | ICD-10-CM | POA: Insufficient documentation

## 2019-01-13 DIAGNOSIS — O209 Hemorrhage in early pregnancy, unspecified: Secondary | ICD-10-CM | POA: Diagnosis not present

## 2019-01-13 DIAGNOSIS — R109 Unspecified abdominal pain: Secondary | ICD-10-CM | POA: Diagnosis not present

## 2019-01-13 DIAGNOSIS — Z3A01 Less than 8 weeks gestation of pregnancy: Secondary | ICD-10-CM | POA: Insufficient documentation

## 2019-01-13 DIAGNOSIS — O99331 Smoking (tobacco) complicating pregnancy, first trimester: Secondary | ICD-10-CM | POA: Insufficient documentation

## 2019-01-13 DIAGNOSIS — Z8759 Personal history of other complications of pregnancy, childbirth and the puerperium: Secondary | ICD-10-CM | POA: Insufficient documentation

## 2019-01-13 DIAGNOSIS — F17210 Nicotine dependence, cigarettes, uncomplicated: Secondary | ICD-10-CM | POA: Insufficient documentation

## 2019-01-13 LAB — CBC
HCT: 43.2 % (ref 36.0–46.0)
Hemoglobin: 14.9 g/dL (ref 12.0–15.0)
MCH: 32.3 pg (ref 26.0–34.0)
MCHC: 34.5 g/dL (ref 30.0–36.0)
MCV: 93.5 fL (ref 80.0–100.0)
Platelets: 364 10*3/uL (ref 150–400)
RBC: 4.62 MIL/uL (ref 3.87–5.11)
RDW: 12.2 % (ref 11.5–15.5)
WBC: 15.5 10*3/uL — ABNORMAL HIGH (ref 4.0–10.5)
nRBC: 0 % (ref 0.0–0.2)

## 2019-01-13 LAB — URINALYSIS, ROUTINE W REFLEX MICROSCOPIC

## 2019-01-13 LAB — WET PREP, GENITAL
Clue Cells Wet Prep HPF POC: NONE SEEN
Sperm: NONE SEEN
Trich, Wet Prep: NONE SEEN
Yeast Wet Prep HPF POC: NONE SEEN

## 2019-01-13 LAB — RAPID URINE DRUG SCREEN, HOSP PERFORMED
Amphetamines: NOT DETECTED
Barbiturates: NOT DETECTED
Benzodiazepines: NOT DETECTED
Cocaine: POSITIVE — AB
Opiates: NOT DETECTED
Tetrahydrocannabinol: POSITIVE — AB

## 2019-01-13 LAB — URINALYSIS, MICROSCOPIC (REFLEX): RBC / HPF: >50 RBC/hpf (ref 0–5)

## 2019-01-13 LAB — HIV ANTIBODY (ROUTINE TESTING W REFLEX): HIV Screen 4th Generation wRfx: NONREACTIVE

## 2019-01-13 LAB — POCT PREGNANCY, URINE: Preg Test, Ur: POSITIVE — AB

## 2019-01-13 LAB — HCG, QUANTITATIVE, PREGNANCY: hCG, Beta Chain, Quant, S: 49 m[IU]/mL — ABNORMAL HIGH (ref <5)
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
## 2019-01-13 NOTE — Discharge Instructions (Signed)
Vaginal Bleeding During Pregnancy, First Trimester ° °A small amount of bleeding from the vagina (spotting) is relatively common during early pregnancy. It usually stops on its own. Various things may cause bleeding or spotting during early pregnancy. Some bleeding may be related to the pregnancy, and some may not. In many cases, the bleeding is normal and is not a problem. However, bleeding can also be a sign of something serious. Be sure to tell your health care provider about any vaginal bleeding right away. °Some possible causes of vaginal bleeding during the first trimester include: °· Infection or inflammation of the cervix. °· Growths (polyps) on the cervix. °· Miscarriage or threatened miscarriage. °· Pregnancy tissue developing outside of the uterus (ectopic pregnancy). °· A mass of tissue developing in the uterus due to an egg being fertilized incorrectly (molar pregnancy). °Follow these instructions at home: °Activity °· Follow instructions from your health care provider about limiting your activity. Ask what activities are safe for you. °· If needed, make plans for someone to help with your regular activities. °· Do not have sex or orgasms until your health care provider says that this is safe. °General instructions °· Take over-the-counter and prescription medicines only as told by your health care provider. °· Pay attention to any changes in your symptoms. °· Do not use tampons or douche. °· Write down how many pads you use each day, how often you change pads, and how soaked (saturated) they are. °· If you pass any tissue from your vagina, save the tissue so you can show it to your health care provider. °· Keep all follow-up visits as told by your health care provider. This is important. °Contact a health care provider if: °· You have vaginal bleeding during any part of your pregnancy. °· You have cramps or labor pains. °· You have a fever. °Get help right away if: °· You have severe cramps in your  back or abdomen. °· You pass large clots or a large amount of tissue from your vagina. °· Your bleeding increases. °· You feel light-headed or weak, or you faint. °· You have chills. °· You are leaking fluid or have a gush of fluid from your vagina. °Summary °· A small amount of bleeding (spotting) from the vagina is relatively common during early pregnancy. °· Various things may cause bleeding or spotting in early pregnancy. °· Be sure to tell your health care provider about any vaginal bleeding right away. °This information is not intended to replace advice given to you by your health care provider. Make sure you discuss any questions you have with your health care provider. °Document Released: 10/19/2004 Document Revised: 04/30/2018 Document Reviewed: 04/13/2016 °Elsevier Patient Education © 2020 [employer] ° °

## 2019-01-13 NOTE — MAU Provider Note (Signed)
Chief Complaint: Abdominal Pain, Back Pain, and Vaginal Bleeding   First Provider Initiated Contact with Patient 01/13/19 0932     SUBJECTIVE HPI: Bonnie Mccoy is a 33 y.o. [id number] at [redacted]w[redacted]d by LMP who presents to [facility] reporting vaginal bleeding, abdominal pain, and back pain. Symptoms started on Friday.  Initially was spotting but has become more like a period. Is not saturating pads or passing blood clots. Denies fever/chills, vomiting, diarrhea, constipation, or dysuria. Has some nausea. Admits to using cocaine yesterday.   Location: abdomen & back Quality: cramping Severity: 8/10 on pain scale Duration: 3 days Timing: intermittent Modifying factors: none Associated signs and symptoms: vaginal bleeding  Past Medical History:  Diagnosis Date  . Abortion   . Arthritis   . Lead poisoning   . Sciatica   . Vaginal Pap smear, abnormal    OB History  Gravida Para Term Preterm AB Living  8 4 3 1 3 4   SAB TAB Ectopic Multiple Live Births    3     4    # Outcome Date GA Lbr Len/2nd Weight Sex Delivery Anes PTL Lv  8 Current           7 TAB           6 TAB           5 TAB           4 Preterm      Vag-Spont   LIV  3 Term      Vag-Spont   LIV  2 Term      Vag-Spont   LIV  1 Term      Vag-Spont   LIV   Past Surgical History:  Procedure Laterality Date  . CERVICAL BIOPSY    . WISDOM TOOTH EXTRACTION     Social History   Socioeconomic History  . Marital status: Single    Spouse name: Not on file  . Number of children: Not on file  . Years of education: Not on file  . Highest education level: Not on file  Occupational History  . Not on file  Tobacco Use  . Smoking status: Current Every Day Smoker    Packs/day: 0.50    Types: Cigarettes  . Smokeless tobacco: Never Used  Substance and Sexual Activity  . Alcohol use: Yes    Comment: occasionally   . Drug use: Yes    Types: Marijuana, Cocaine    Comment: last cocaine 01/12/19, last marijuana 01/12/19  .  Sexual activity: Yes  Other Topics Concern  . Not on file  Social History Narrative  . Not on file   Social Determinants of Health   Financial Resource Strain:   . Difficulty of Paying Living Expenses: Not on file  Food Insecurity:   . Worried About 01/14/19 in the Last Year: Not on file  . Ran Out of Food in the Last Year: Not on file  Transportation Needs:   . Lack of Transportation (Medical): Not on file  . Lack of Transportation (Non-Medical): Not on file  Physical Activity:   . Days of Exercise per Week: Not on file  . Minutes of Exercise per Session: Not on file  Stress:   . Feeling of Stress : Not on file  Social Connections:   . Frequency of Communication with Friends and Family: Not on file  . Frequency of Social Gatherings with Friends and Family: Not on file  . Attends Religious Services: Not  on file  . Active Member of Clubs or Organizations: Not on file  . Attends BankerClub or Organization Meetings: Not on file  . Marital Status: Not on file  Intimate Partner Violence:   . Fear of Current or Ex-Partner: Not on file  . Emotionally Abused: Not on file  . Physically Abused: Not on file  . Sexually Abused: Not on file   History reviewed. No pertinent family history. No current facility-administered medications on file prior to encounter.   Current Outpatient Medications on File Prior to Encounter  Medication Sig Dispense Refill  . EPINEPHrine 0.3 mg/0.3 mL IJ SOAJ injection Inject 0.3 mLs (0.3 mg total) into the muscle as needed for anaphylaxis. 1 each 0   Allergies  Allergen Reactions  . Bee Venom Anaphylaxis  . Shellfish Allergy Anaphylaxis    I have reviewed patient's Past Medical Hx, Surgical Hx, Family Hx, Social Hx, medications and allergies.   Review of Systems  Constitutional: Negative.   Gastrointestinal: Positive for abdominal pain and nausea. Negative for constipation, diarrhea and vomiting.  Genitourinary: Positive for vaginal bleeding.  Negative for dysuria and vaginal discharge.  Musculoskeletal: Positive for back pain.    OBJECTIVE Patient Vitals for the past 24 hrs:  BP Temp Temp src Pulse Resp SpO2 Height Weight  01/13/19 1159 121/82 - - 84 - - - -  01/13/19 0927 127/85 98.3 F (36.8 C) Oral 81 18 100 % - -  01/13/19 0912 - - - - - - 5' 4.5" (1.638 m) 72.1 kg   Constitutional: Well-developed, well-nourished female in no acute distress.  Cardiovascular: normal rate & rhythm, no murmur Respiratory: normal rate and effort. Lung sounds clear throughout GI: Abd soft, non-tender, Pos BS x 4. No guarding or rebound tenderness MS: Extremities nontender, no edema, normal ROM Neurologic: Alert and oriented x 4.  GU:     SPECULUM EXAM: NEFG, small amount of dark red blood. Cervix pink/smooth/not friable  BIMANUAL: No CMT. cervix closed; uterus normal size, no adnexal tenderness or masses.    LAB RESULTS Results for orders placed or performed during the hospital encounter of 01/13/19 (from the past 24 hour(s))  Urinalysis, Routine w reflex microscopic     Status: Abnormal   Collection Time: 01/13/19  9:06 AM  Result Value Ref Range   Color, Urine RED (A) YELLOW   APPearance TURBID (A) CLEAR   Specific Gravity, Urine  1.005 - 1.030    TEST NOT REPORTED DUE TO COLOR INTERFERENCE OF URINE PIGMENT   pH  5.0 - 8.0    TEST NOT REPORTED DUE TO COLOR INTERFERENCE OF URINE PIGMENT   Glucose, UA (A) NEGATIVE mg/dL    TEST NOT REPORTED DUE TO COLOR INTERFERENCE OF URINE PIGMENT   Hgb urine dipstick (A) NEGATIVE    TEST NOT REPORTED DUE TO COLOR INTERFERENCE OF URINE PIGMENT   Bilirubin Urine (A) NEGATIVE    TEST NOT REPORTED DUE TO COLOR INTERFERENCE OF URINE PIGMENT   Ketones, ur (A) NEGATIVE mg/dL    TEST NOT REPORTED DUE TO COLOR INTERFERENCE OF URINE PIGMENT   Protein, ur (A) NEGATIVE mg/dL    TEST NOT REPORTED DUE TO COLOR INTERFERENCE OF URINE PIGMENT   Nitrite (A) NEGATIVE    TEST NOT REPORTED DUE TO COLOR  INTERFERENCE OF URINE PIGMENT   Leukocytes,Ua (A) NEGATIVE    TEST NOT REPORTED DUE TO COLOR INTERFERENCE OF URINE PIGMENT  Pregnancy, urine POC     Status: Abnormal   Collection Time: 01/13/19  9:06 AM  Result Value Ref Range   Preg Test, Ur POSITIVE (A) NEGATIVE  Urine rapid drug screen (hosp performed)     Status: Abnormal   Collection Time: 01/13/19  9:06 AM  Result Value Ref Range   Opiates NONE DETECTED NONE DETECTED   Cocaine POSITIVE (A) NONE DETECTED   Benzodiazepines NONE DETECTED NONE DETECTED   Amphetamines NONE DETECTED NONE DETECTED   Tetrahydrocannabinol POSITIVE (A) NONE DETECTED   Barbiturates NONE DETECTED NONE DETECTED  Urinalysis, Microscopic (reflex)     Status: Abnormal   Collection Time: 01/13/19  9:06 AM  Result Value Ref Range   RBC / HPF >50 0 - 5 RBC/hpf   WBC, UA 11-20 0 - 5 WBC/hpf   Bacteria, UA FEW (A) NONE SEEN   Squamous Epithelial / LPF 6-10 0 - 5   Urine-Other MUCOUS PRESENT   Wet prep, genital     Status: Abnormal   Collection Time: 01/13/19  9:44 AM   Specimen: PATH Cytology Cervicovaginal Ancillary Only  Result Value Ref Range   Yeast Wet Prep HPF POC NONE SEEN NONE SEEN   Trich, Wet Prep NONE SEEN NONE SEEN   Clue Cells Wet Prep HPF POC NONE SEEN NONE SEEN   WBC, Wet Prep HPF POC MODERATE (A) NONE SEEN   Sperm NONE SEEN   CBC     Status: Abnormal   Collection Time: 01/13/19 10:04 AM  Result Value Ref Range   WBC 15.5 (H) 4.0 - 10.5 K/uL   RBC 4.62 3.87 - 5.11 MIL/uL   Hemoglobin 14.9 12.0 - 15.0 g/dL   HCT 16.1 09.6 - 04.5 %   MCV 93.5 80.0 - 100.0 fL   MCH 32.3 26.0 - 34.0 pg   MCHC 34.5 30.0 - 36.0 g/dL   RDW 40.9 81.1 - 91.4 %   Platelets 364 150 - 400 K/uL   nRBC 0.0 0.0 - 0.2 %  ABO/Rh     Status: None   Collection Time: 01/13/19 10:04 AM  Result Value Ref Range   ABO/RH(D)      O POS Performed at [facility], [address], [place], [place] [address]   hCG, quantitative, pregnancy     Status: Abnormal    Collection Time: 01/13/19 10:04 AM  Result Value Ref Range   hCG, Beta Chain, Quant, S 49 (H) <5 mIU/mL  HIV antibody     Status: None   Collection Time: 01/13/19 10:04 AM  Result Value Ref Range   HIV Screen 4th Generation wRfx NON REACTIVE NON REACTIVE    IMAGING US OB LESS THAN 14 WEEKS WITH OB TRANSVAGINAL  Result Date: 01/13/2019 CLINICAL DATA:  Vaginal bleeding with positive beta HCG EXAM: OBSTETRIC <14 WK [place] AND TRANSVAGINAL OB US TECHNIQUE: Both transabdominal and transvaginal ultrasound examinations were performed for complete evaluation of the gestation as well as the maternal uterus, adnexal regions, and pelvic cul-de-sac. Transvaginal technique was performed to assess early pregnancy. COMPARISON:  None. FINDINGS: Intrauterine gestational sac: Not visualized Yolk sac:  Not visualized Embryo:  Not visualized Cardiac Activity: Not visualized Subchorionic hemorrhage:  None visualized. Maternal uterus/adnexae: Cervical os is closed. The endometrium measures 5 mm in thickness. Right ovary measures 2.3 x 2.7 x 2.7 cm. Left ovary measures 1.9 x 2.4 x 2.0 cm. No extrauterine pelvic or adnexal mass. No free pelvic fluid. IMPRESSION: Study within normal limits. No intrauterine gestation evident. Given positive beta HCG, differential considerations must include intrauterine gestation too early to be  seen by either transabdominal transvaginal technique; recent spontaneous abortion; ectopic gestation. This circumstance warrants close clinical and laboratory surveillance. Timing of repeat sonographic evaluation in large part will depend on beta HCG values going forward. Electronically Signed   By: Lowella Grip III M.D.   On: 01/13/2019 11:43    MAU COURSE Orders Placed This Encounter  Procedures  . Wet prep, genital  . US OB LESS THAN 14 WEEKS WITH OB TRANSVAGINAL  . Urinalysis, Routine w reflex microscopic  . Urine rapid drug screen (hosp performed)  . CBC  . hCG, quantitative, pregnancy   . HIV antibody  . Urinalysis, Microscopic (reflex)  . Pregnancy, urine POC  . ABO/Rh  . Discharge patient   No orders of the defined types were placed in this encounter.   MDM +UPT UA, wet prep, GC/chlamydia, CBC, ABO/Rh, quant hCG, and [place] today to rule out ectopic pregnancy which can be life threatening.   RH positive  Wet prep negative  Ultrasound shows no IUP or ectopic. Hcg 49 today.   This abdominal pain & vaginal bleeding could represent a normal pregnancy, spontaneous abortion, or even an ectopic pregnancy which can be life-threatening. Cultures were obtained to rule out pelvic infection.  Will get serial HCGs  ASSESSMENT 1. Pregnancy of unknown anatomic location   2. Vaginal bleeding in pregnancy, first trimester   3. Abdominal pain during pregnancy in first trimester     PLAN Discharge home in stable condition. SAB vs ectopic precautions GC/CT pending Scheduled for repeat HCG at [place] on Wednesday  Follow-up [facility] Follow up.   Specialty: Obstetrics and Gynecology Why: return for worsening symptoms Contact information: [address] [id number] [place] [phone]         Allergies as of 01/13/2019      Reactions   Bee Venom Anaphylaxis   Shellfish Allergy Anaphylaxis      Medication List    TAKE these medications   EPINEPHrine 0.3 mg/0.3 mL Soaj injection Commonly known as: EPI-PEN Inject 0.3 mLs (0.3 mg total) into the muscle as needed for anaphylaxis.        Jorje Guild, NP 01/13/2019  12:51 PM

## 2019-01-14 LAB — GC/CHLAMYDIA PROBE AMP (~~LOC~~) NOT AT ARMC
Chlamydia: NEGATIVE
Comment: NEGATIVE
Comment: NORMAL
Neisseria Gonorrhea: NEGATIVE

## 2019-01-14 LAB — ABO/RH: ABO/RH(D): O POS

## 2019-01-15 ENCOUNTER — Other Ambulatory Visit: Payer: Self-pay

## 2019-01-15 ENCOUNTER — Ambulatory Visit (INDEPENDENT_AMBULATORY_CARE_PROVIDER_SITE_OTHER): Payer: Medicaid Other

## 2019-01-15 DIAGNOSIS — O209 Hemorrhage in early pregnancy, unspecified: Secondary | ICD-10-CM

## 2019-01-15 DIAGNOSIS — O3680X0 Pregnancy with inconclusive fetal viability, not applicable or unspecified: Secondary | ICD-10-CM

## 2019-01-15 LAB — BETA HCG QUANT (REF LAB): hCG Quant: 19 m[IU]/mL

## 2019-01-15 NOTE — Progress Notes (Signed)
Pt here today following visit to MAU for pregnancy of unknown location; pt complaining of abdominal pain and vaginal bleeding at that time. Pt reports passing clots and period like bleeding. Pt states bleeding has slowed down and stopped as of this AM. Pt reports abdominal pain is 4/10 today, improved with Tylenol. Pt reports using cocaine last night and drinking alcohol. Ectopic precautions reviewed with pt. Beta HCG lab drawn. Explained to pt she needs to be available for a phone call in the next few hours, phone number verified.   Beta HCG result today is 19; result and hx reviewed with Kerry Hough, PA who states the pt is having a miscarriage and should return for SAB follow up in 2 weeks.  Called pt with beta HCG result and provider recommendation. Pt agrees to return for provider visit in 2 weeks and go back to MAU with any severe pain or if saturating 1 pad/hr. Pt encouraged to call with any concerns.  [place] office notified to schedule appt.  Apolonio Schneiders RN  01/15/19

## 2019-01-15 NOTE — Progress Notes (Signed)
Patient seen and assessed by nursing staff during this encounter. I have reviewed the chart and agree with the documentation and plan.  Kerry Hough, PA-C 01/15/2019 3:13 PM

## 2019-01-24 NOTE — L&D Delivery Note (Signed)
  Bonnie Mccoy, Bonnie Mccoy [[id number]]   Bonnie Mccoy is a 34 y.o. [phone] at [redacted]w[redacted]d by 23 week ultrasound admitted for preterm labor, pregnancy complicated by multiple gestation, late to prenatal care, AMA, pt admitted drug use, trich infection.  Delivery Note  Pt to the OR at 7 cm with urge to push, for preparation of vaginal delivery- patient continues to decline epidural, verbalizes understanding of risks and benefits of both epidural and nonmedicated delivery with a preterm vaginal delivery.  AROM 1731, clear fluid. At 5:37 PM a viable female was delivered via vertex vaginal delivery, cord clamp and cut immediately, neonate to NICU team standing by.  APGAR: 6/9; weight  .   Cord: 3 vessels with the following complications: None.    Anesthesia: None Episiotomy: None Lacerations:  None  Est. Blood Loss (mL):  50   Vaginal exam and bedside ultrasound after delivery revealed transverse position of baby B- converted immediately to a c-section, see OP note.  Lyman Speller, SNM 01/04/2020, 6:20 PM

## 2019-01-30 ENCOUNTER — Ambulatory Visit (INDEPENDENT_AMBULATORY_CARE_PROVIDER_SITE_OTHER): Payer: Medicaid Other | Admitting: Obstetrics and Gynecology

## 2019-01-30 ENCOUNTER — Other Ambulatory Visit: Payer: Self-pay

## 2019-01-30 ENCOUNTER — Encounter: Payer: Self-pay | Admitting: Obstetrics and Gynecology

## 2019-01-30 VITALS — BP 121/78 | HR 81 | Ht 64.0 in | Wt 163.0 lb

## 2019-01-30 DIAGNOSIS — O3680X0 Pregnancy with inconclusive fetal viability, not applicable or unspecified: Secondary | ICD-10-CM

## 2019-01-30 DIAGNOSIS — F1990 Other psychoactive substance use, unspecified, uncomplicated: Secondary | ICD-10-CM | POA: Diagnosis not present

## 2019-01-30 DIAGNOSIS — O039 Complete or unspecified spontaneous abortion without complication: Secondary | ICD-10-CM | POA: Diagnosis not present

## 2019-01-30 NOTE — Progress Notes (Signed)
GYNECOLOGY ENCOUNTER NOTE  History:     Bonnie Mccoy is a 34 y.o. [phone] female here a follow up quant from a recent SAB. Since her last visit her Quant dropped from 49> 19.  O positive blood type, refused all types of birth control.  Admits to using drugs; cocaine.  States she has been depressed since her mom passed. States she has a plan to see her therapist.    Gynecologic History Patient's last menstrual period was 12/03/2018. Contraception: none  Obstetric History OB History  Gravida Para Term Preterm AB Living  8 4 3 1 3 4   SAB TAB Ectopic Multiple Live Births    3     4    # Outcome Date GA Lbr Len/2nd Weight Sex Delivery Anes PTL Lv  8 Gravida           7 TAB           6 TAB           5 TAB           4 Preterm      Vag-Spont   LIV  3 Term      Vag-Spont   LIV  2 Term      Vag-Spont   LIV  1 Term      Vag-Spont   LIV    Past Medical History:  Diagnosis Date  . Abortion   . Arthritis   . Lead poisoning   . Sciatica   . Vaginal Pap smear, abnormal     Past Surgical History:  Procedure Laterality Date  . CERVICAL BIOPSY    . WISDOM TOOTH EXTRACTION      Current Outpatient Medications on File Prior to Visit  Medication Sig Dispense Refill  . EPINEPHrine 0.3 mg/0.3 mL IJ SOAJ injection Inject 0.3 mLs (0.3 mg total) into the muscle as needed for anaphylaxis. 1 each 0   No current facility-administered medications on file prior to visit.    Allergies  Allergen Reactions  . Bee Venom Anaphylaxis  . Shellfish Allergy Anaphylaxis    Social History:  reports that she has been smoking cigarettes. She has been smoking about 0.50 packs per day. She has never used smokeless tobacco. She reports current alcohol use. She reports current drug use. Drugs: Marijuana and Cocaine.  No family history on file.  The following portions of the patient's history were reviewed and updated as appropriate: allergies, current medications, past family history, past medical  history, past social history, past surgical history and problem list.  Review of Systems Pertinent items noted in HPI and remainder of comprehensive ROS otherwise negative.  Physical Exam:  BP 121/78   Pulse 81   Ht 5\' 4"  (1.626 m)   Wt 163 lb (73.9 kg)   LMP 12/03/2018   Breastfeeding No   BMI 27.98 kg/m  CONSTITUTIONAL: Well-developed, well-nourished female in no acute distress.  HENT:  Normocephalic, atraumatic, External right and left ear normal. Oropharynx is clear and moist NEUROLOGIC: Alert and oriented to person, place, and time.  PSYCHIATRIC: Normal mood and affect. Patient anxious and talking fast. Patient unable to sit still  CARDIOVASCULAR: Normal heart rate noted, regular rhythm RESPIRATORY: Clear to auscultation bilaterally. Effort and breath sounds  ABDOMEN: Soft, no distention noted.  No tenderness, rebound or guarding.     Assessment and Plan:   1. SAB (spontaneous abortion)  - Beta hCG quant (ref lab); Future - Beta hCG quant (ref lab)  2.  Pregnancy of unknown anatomic location  Stable with appropriate drop in Hcg levels   3. Drug use  Highly recommend birth control    Jessup Ogas, Artist Pais, [place] for [employer], [place]

## 2019-01-31 LAB — BETA HCG QUANT (REF LAB): hCG Quant: <1 m[IU]/mL

## 2019-02-01 DIAGNOSIS — F1990 Other psychoactive substance use, unspecified, uncomplicated: Secondary | ICD-10-CM | POA: Insufficient documentation

## 2019-10-30 ENCOUNTER — Encounter: Payer: Self-pay | Admitting: *Deleted

## 2019-10-30 ENCOUNTER — Other Ambulatory Visit: Payer: Self-pay

## 2019-10-30 ENCOUNTER — Telehealth (INDEPENDENT_AMBULATORY_CARE_PROVIDER_SITE_OTHER): Payer: Medicaid Other | Admitting: *Deleted

## 2019-10-30 DIAGNOSIS — Z8619 Personal history of other infectious and parasitic diseases: Secondary | ICD-10-CM | POA: Insufficient documentation

## 2019-10-30 DIAGNOSIS — O0990 Supervision of high risk pregnancy, unspecified, unspecified trimester: Secondary | ICD-10-CM | POA: Insufficient documentation

## 2019-10-30 DIAGNOSIS — O99320 Drug use complicating pregnancy, unspecified trimester: Secondary | ICD-10-CM

## 2019-10-30 DIAGNOSIS — M5430 Sciatica, unspecified side: Secondary | ICD-10-CM

## 2019-10-30 DIAGNOSIS — F1990 Other psychoactive substance use, unspecified, uncomplicated: Secondary | ICD-10-CM

## 2019-10-30 DIAGNOSIS — F329 Major depressive disorder, single episode, unspecified: Secondary | ICD-10-CM

## 2019-10-30 DIAGNOSIS — O09899 Supervision of other high risk pregnancies, unspecified trimester: Secondary | ICD-10-CM | POA: Insufficient documentation

## 2019-10-30 DIAGNOSIS — O09219 Supervision of pregnancy with history of pre-term labor, unspecified trimester: Secondary | ICD-10-CM

## 2019-10-30 DIAGNOSIS — F32A Depression, unspecified: Secondary | ICD-10-CM | POA: Insufficient documentation

## 2019-10-30 DIAGNOSIS — F3289 Other specified depressive episodes: Secondary | ICD-10-CM

## 2019-10-30 DIAGNOSIS — O09299 Supervision of pregnancy with other poor reproductive or obstetric history, unspecified trimester: Secondary | ICD-10-CM

## 2019-10-30 DIAGNOSIS — O99340 Other mental disorders complicating pregnancy, unspecified trimester: Secondary | ICD-10-CM

## 2019-10-30 NOTE — Progress Notes (Signed)
2:15 Bonnie Mccoy not connected virtually for her appointment today. I called and she informed me she does not have internet access-not enough data so I informed her we will do telephone today and then all in person.   New OB Intake  I connected with  Bonnie Mccoy on 10/30/19 at 2:15 by telephone and verified that I am speaking with the correct person using two identifiers. Nurse is located at [facility] and pt is located at home.  I discussed the limitations, risks, security and privacy concerns of performing an evaluation and management service by telephone and the availability of in person appointments. I also discussed with the patient that there may be a patient responsible charge related to this service. The patient expressed understanding and agreed to proceed.  I explained I am completing New OB Intake today. We discussed her EDD of 03/10/20 that is based on approximate LMP of 06/04/19 9 within days). Pt is G9/P3144. I reviewed her allergies, medications, Medical/Surgical/OB history, and appropriate screenings. I informed her of [facility] services. She does have elevated phq9 . She reports to goes to [facility] on [place]. She would like to see our [facility] as well. I informed her I will make referral and registar will contact her with appointment. She reports has issues with Cocaine, depression and marijuana but trying to stop Cocaine use. Denies thoughts of hurting self or thoughts of suicide.  Based on history, this is a/an complicated by Drug use, history of PTD, reccurent TAB/ SAB pregnancy. She already has WIC.   Concerns addressed today  She does have transportation issues and will need to sign a waiver at her first visit and be given transportatin resources/ phone number.   MyChart/Babyscripts Patient not candidate for MyChart due to no internet.  I explained pt will have all visits in office due to this issue. Patient states she would like to get  Babyscripts;  instructions given. She states  she will download somewhere she has access to wifi.   Blood Pressure Cuff  N/a since patient will have all in person visits.   Anatomy US Explained first scheduled [place] will be as soon as possible since she is already 21 weeks and I will schedule [place] later today and she will receive the appointment at her new ob visit.  Anatomy US scheduled for 11/14/19 at 1015.  Labs Discussed Avelina Laine genetic screening with patient- patient unsure if she would like to do this. . Routine prenatal labs needed.  First visit review I reviewed new OB appt with pt. I explained she will have a pelvic exam, ob bloodwork with genetic screening if desired, and PAP smear. Explained pt will be seen by Dr. Vergie Living at first visit; encounter routed to appropriate provider.  Cerenity Goshorn,RN 10/30/2019  2:19 PM

## 2019-10-30 NOTE — Patient Instructions (Signed)
  At [facility], we work as an integrated team, providing care to address both physical and emotional health. Your medical provider may refer you to see our Behavioral Health Clinician (BHC) on the same day you see your medical provider, as availability permits.  Our BHC is available to all patients, visits generally last between 20-30 minutes, but can be longer or shorter, depending on patient need. The BHC offers help with stress management, coping with symptoms of depression and anxiety, major life changes , sleep issues, changing risky behavior, grief and loss, life stress, working on personal life goals, and  behavioral health issues, as these all affect your overall health and wellness.  The BHC is NOT available for the following: FMLA paperwork, court-ordered evaluations, specialty assessments (custody or disability), letters to employers, or obtaining certification for an emotional support animal. The BHC does not provide long-term therapy. You have the right to refuse integrated behavioral health services, or to reschedule to see the BHC at a later date.  Exception: If you are having thoughts of suicide, we require that you either see the BHC for further assessment, or contract for safety with your medical provider. Confidentiality exception: If it is suspected that a child or disabled adult is being abused or neglected, we are required by law to report that to either Child Protective Services or Adult Protective Services.  If you have a diagnosis of Bipolar affective disorder, Schizophrenia, or recurrent Major depressive disorder, we will recommend that you establish care with a psychiatrist, as these are lifelong, chronic conditions, and we want your overall emotional health and medications to be more closely monitored. If you anticipate needing extended maternity leave due to mental illness, it it recommended you inform your medical provider, so  we can put in a referral to a  psychiatrist as soon as possible. The BHC is unable to recommend an extended maternity leave for mental health issues. Your medical provider or BHC may refer you to a therapist for ongoing, traditional therapy, or to a psychiatrist, for medication management, if it would benefit your overall health. Depending on your insurance, you may have a copay to see the BHC. If you are uninsured, it is recommended that you apply for financial assistance. (Forms may be requested at the front desk for in-person visits, via MyChart, or request a form during a virtual visit).  If you see the BHC more than 6 times, you will have to complete a comprehensive clinical assessment interview with the BHC to resume integrated services.  For virtual visits with the BHC, you must be physically in the state of [place] at the time of the visit. For example, if you live in [place], you will have to do an in-person visit with the BHC. If you are going out of the state or country for any reason, the BHC may see you virtually when you return to [place], but not while you are physically outside of .    

## 2019-11-02 NOTE — Progress Notes (Signed)
Patient was assessed and managed by nursing staff during this encounter. I have reviewed the chart and agree with the documentation and plan. I have also made any necessary editorial changes.  [place] Bing, MD 11/02/2019 3:26 PM

## 2019-11-03 ENCOUNTER — Other Ambulatory Visit (HOSPITAL_COMMUNITY)
Admission: RE | Admit: 2019-11-03 | Discharge: 2019-11-03 | Disposition: A | Discharge status: Home / Self Care | Payer: Medicaid Other | Source: Ambulatory Visit | Attending: Obstetrics and Gynecology | Admitting: Obstetrics and Gynecology

## 2019-11-03 ENCOUNTER — Other Ambulatory Visit: Payer: Self-pay

## 2019-11-03 ENCOUNTER — Encounter: Payer: Self-pay | Admitting: Obstetrics and Gynecology

## 2019-11-03 ENCOUNTER — Ambulatory Visit (INDEPENDENT_AMBULATORY_CARE_PROVIDER_SITE_OTHER): Payer: Medicaid Other | Admitting: Obstetrics and Gynecology

## 2019-11-03 VITALS — BP 112/66 | HR 77 | Wt 174.2 lb

## 2019-11-03 DIAGNOSIS — O0930 Supervision of pregnancy with insufficient antenatal care, unspecified trimester: Secondary | ICD-10-CM | POA: Insufficient documentation

## 2019-11-03 DIAGNOSIS — O0932 Supervision of pregnancy with insufficient antenatal care, second trimester: Secondary | ICD-10-CM

## 2019-11-03 DIAGNOSIS — O0990 Supervision of high risk pregnancy, unspecified, unspecified trimester: Secondary | ICD-10-CM

## 2019-11-03 DIAGNOSIS — O30092 Twin pregnancy, unable to determine number of placenta and number of amniotic sacs, second trimester: Secondary | ICD-10-CM

## 2019-11-03 DIAGNOSIS — Z8619 Personal history of other infectious and parasitic diseases: Secondary | ICD-10-CM

## 2019-11-03 DIAGNOSIS — A599 Trichomoniasis, unspecified: Secondary | ICD-10-CM

## 2019-11-03 DIAGNOSIS — Z3A21 21 weeks gestation of pregnancy: Secondary | ICD-10-CM

## 2019-11-03 DIAGNOSIS — O09529 Supervision of elderly multigravida, unspecified trimester: Secondary | ICD-10-CM | POA: Insufficient documentation

## 2019-11-03 DIAGNOSIS — F1990 Other psychoactive substance use, unspecified, uncomplicated: Secondary | ICD-10-CM

## 2019-11-03 DIAGNOSIS — O09899 Supervision of other high risk pregnancies, unspecified trimester: Secondary | ICD-10-CM

## 2019-11-03 DIAGNOSIS — O30049 Twin pregnancy, dichorionic/diamniotic, unspecified trimester: Secondary | ICD-10-CM | POA: Insufficient documentation

## 2019-11-03 DIAGNOSIS — O99012 Anemia complicating pregnancy, second trimester: Secondary | ICD-10-CM

## 2019-11-03 DIAGNOSIS — O09522 Supervision of elderly multigravida, second trimester: Secondary | ICD-10-CM

## 2019-11-03 MED ORDER — EPINEPHRINE 0.3 MG/0.3ML IJ SOAJ: 0.3000 mg | INTRAMUSCULAR | 0 refills | Status: DC | PRN

## 2019-11-03 MED ORDER — EPINEPHRINE 0.3 MG/0.3ML IJ SOAJ: 0.3000 mg | INTRAMUSCULAR | 0 refills | Status: AC | PRN

## 2019-11-03 NOTE — Progress Notes (Signed)
Wants refill on EpiPen

## 2019-11-03 NOTE — Progress Notes (Signed)
New OB Note  11/03/2019   Clinic: [facility] for [facility]  Chief Complaint: NOB  Transfer of Care Patient: no  History of Present Illness: Bonnie Mccoy is a 34 y.o. [id number] @ 21/5 weeks (EDC 2/16 [tentative], based on Patient's last menstrual period was 06/04/2019 (within days).).  Preg complicated by has Drug use; History of gonorrhea; History of chlamydia; Sciatica; Supervision of high risk pregnancy, antepartum; Depression; History of preterm delivery, currently pregnant; AMA (advanced maternal age) multigravida 35+; Late prenatal care affecting pregnancy; and Twin preg, unable to dtrm num plcnta & amnio sacs, 2nd tri on their problem list.   Any events prior to today's visit: no Her periods were: qmonth, regular She was using no method when she conceived.  She has Negative signs or symptoms of nausea/vomiting of pregnancy. She has Negative signs or symptoms of miscarriage or preterm labor On any medications around the time she conceived/early pregnancy: No    Patient has not had any PNC this pregnancy including no h/o any u/s anywhere.   ROS: A 12-point review of systems was performed and negative, except as stated in the above HPI.  OBGYN History: As per HPI. OB History  Gravida Para Term Preterm AB Living  9 4 3 1 4 4   SAB TAB Ectopic Multiple Live Births  1 3     4     # Outcome Date GA Lbr Len/2nd Weight Sex Delivery Anes PTL Lv  9 Current           8 SAB 12/2018 [redacted]w[redacted]d            Birth Comments: twins  7 TAB 2018          6 TAB 2015          5 TAB 2014          4 Term 2013 [redacted]w[redacted]d  7 lb 6 oz (3.345 kg) M Vag-Spont None  LIV     Birth Comments: sciatica - belly and back brace  3 Term 2010 [redacted]w[redacted]d  6 lb 12 oz (3.062 kg) F Vag-Spont None  LIV     Birth Comments: sciatica , used back and belly brace  2 Term 2007 [redacted]w[redacted]d  8 lb 12.8 oz (3.992 kg) M Vag-Spont None  LIV     Birth Comments: bedrest from 2 months , possible preeclampsia  1 Preterm 2002 [redacted]w[redacted]d  4  lb 8 oz (2.041 kg) M Vag-Spont None  LIV     Birth Comments: preterm labor started at 54 week born at 50 week    Prior children are healthy, doing well, and without any problems or issues: yes History of pap smears: unknown   Past Medical History: Past Medical History:  Diagnosis Date  . ADHD   . Arthritis   . Depression   . History of chlamydia   . History of gonorrhea   . Lead poisoning   . Sciatica     Past Surgical History: Past Surgical History:  Procedure Laterality Date  . CERVICAL BIOPSY    . COLPOSCOPY    . WISDOM TOOTH EXTRACTION      Family History:  Family History  Problem Relation Age of Onset  . Hypertension Mother   . Heart failure Mother   . Heart attack Mother     Social History:  Social History   Socioeconomic History  . Marital status: Single    Spouse name: Not on file  . Number of children: Not on file  .  Years of education: Not on file  . Highest education level: Not on file  Occupational History  . Not on file  Tobacco Use  . Smoking status: Current Every Day Smoker    Packs/day: 0.25    Types: Cigarettes  . Smokeless tobacco: Never Used  Vaping Use  . Vaping Use: Never used  Substance and Sexual Activity  . Alcohol use: Not Currently    Comment: occasionally   . Drug use: Yes    Types: Marijuana, Cocaine    Comment: last used 10/23/19  . Sexual activity: Yes    Birth control/protection: None  Other Topics Concern  . Not on file  Social History Narrative  . Not on file   Social Determinants of Health   Financial Resource Strain:   . Difficulty of Paying Living Expenses: Not on file  Food Insecurity: No Food Insecurity  . Worried About Programme researcher, broadcasting/film/video in the Last Year: Never true  . Ran Out of Food in the Last Year: Never true  Transportation Needs: Unmet Transportation Needs  . Lack of Transportation (Medical): Yes  . Lack of Transportation (Non-Medical): Yes  Physical Activity:   . Days of Exercise per Week: Not  on file  . Minutes of Exercise per Session: Not on file  Stress:   . Feeling of Stress : Not on file  Social Connections:   . Frequency of Communication with Friends and Family: Not on file  . Frequency of Social Gatherings with Friends and Family: Not on file  . Attends Religious Services: Not on file  . Active Member of Clubs or Organizations: Not on file  . Attends Banker Meetings: Not on file  . Marital Status: Not on file  Intimate Partner Violence:   . Fear of Current or Ex-Partner: Not on file  . Emotionally Abused: Not on file  . Physically Abused: Not on file  . Sexually Abused: Not on file    Allergy: Allergies  Allergen Reactions  . Bee Venom Anaphylaxis  . Shellfish Allergy Anaphylaxis    Health Maintenance:  Mammogram Up to Date: not applicable  Current Outpatient Medications: Prenatal vitamin  Physical Exam:   BP 112/66   Pulse 77   Wt 174 lb 3.2 oz (79 kg)   LMP 06/04/2019 (Within Days)   BMI 29.44 kg/m  Body mass index is 29.44 kg/m. Contractions: Not present Vag. Bleeding: None. Fundal height: 24 FHTs: 140s twin A and 150s twin B  General appearance: Well nourished, well developed female in no acute distress.  Neck:  Supple, normal appearance, and no thyromegaly  Cardiovascular: S1, S2 normal, no murmur, rub or gallop, regular rate and rhythm Respiratory:  Clear to auscultation bilateral. Normal respiratory effort Abdomen: positive bowel sounds and no masses, hernias; diffusely non tender to palpation, non distended Breasts: patient declines any breast s/s Neuro/Psych:  Normal mood and affect.  Skin:  Warm and dry.  Lymphatic:  No inguinal lymphadenopathy.   Pelvic exam: is not limited by body habitus EGBUS: within normal limits, Vagina: within normal limits and with no blood in the vault, Cervix: normal appearing cervix without discharge or lesions, closed/long/high, Uterus:  enlarged, c/w 24 week size, and Adnexa:  normal  adnexa and no mass, fullness, tenderness  Laboratory: none  Imaging:  Bedside iPad u/s: twin IUP seen with normal FHR and +FM x 2 and subj normal fluid. I do not see a dividing membrane. Unable to do measurements but they look approximately 18 weeks.  Assessment: pt stable  Plan: 1. Supervision of high risk pregnancy, antepartum Routine care. Genetics. Offer afp once dating is confirmed.  Ask about BTL nv - CBC/D/Plt+RPR+Rh+ABO+Rub Ab... - Culture, OB Urine - Genetic Screening - Cytology - PAP( [place]) - Cervicovaginal ancillary only( [place]) - Protein / creatinine ratio, urine - Comprehensive metabolic panel - [place] MFM OB DETAIL +14 WK; Future - [place] MFM OB DETAIL ADDL GEST +14 WK; Future - [id number] 11+Oxyco+Alc+Crt-Bund - TSH  2. Twin preg, unable to dtrm num plcnta & amnio sacs, 2nd tri U/s doesn't have any earlier slots than the 22nd, which she is already scheduled for  3. History of gonorrhea  4. History of preterm delivery, currently pregnant Approximately 31wks with first child and term pregnancies for remainder and not on 17p. Given twins and term pregnancies with prior ones 17p not indicated. Follow up cx length at anatomy u/s. Appears normal today  5. Drug use Screening UDS today. Establish care with [facility]) here. Last etoh use a month ago and last illicit drug use a week ago. Encouragement given to patient  6. Multigravida of advanced maternal age in second trimester See above  7. History of chlamydia  8. Late prenatal care affecting pregnancy in second trimester  Problem list reviewed and updated.  Follow up in 2 weeks.  The nature of [facility] with multiple MDs and other Advanced Practice Providers was explained to patient; also emphasized that residents, students are part of our team.  >50% of 45 min visit spent on counseling and coordination of care.     Cornelia Copa MD Attending Center for  [facility])

## 2019-11-04 LAB — CERVICOVAGINAL ANCILLARY ONLY
Bacterial Vaginitis (gardnerella): NEGATIVE
Candida Glabrata: NEGATIVE
Candida Vaginitis: POSITIVE — AB
Chlamydia: NEGATIVE
Comment: NEGATIVE
Comment: NEGATIVE
Comment: NEGATIVE
Comment: NEGATIVE
Comment: NEGATIVE
Comment: NORMAL
Neisseria Gonorrhea: NEGATIVE
Trichomonas: POSITIVE — AB

## 2019-11-04 LAB — COMPREHENSIVE METABOLIC PANEL
ALT: 13 IU/L (ref 0–32)
AST: 13 IU/L (ref 0–40)
Albumin/Globulin Ratio: 1.5 (ref 1.2–2.2)
Albumin: 3.8 g/dL (ref 3.8–4.8)
Alkaline Phosphatase: 91 IU/L (ref 44–121)
BUN/Creatinine Ratio: 13 (ref 9–23)
BUN: 6 mg/dL (ref 6–20)
Bilirubin Total: <0.2 mg/dL (ref 0.0–1.2)
CO2: 20 mmol/L (ref 20–29)
Calcium: 9 mg/dL (ref 8.7–10.2)
Chloride: 103 mmol/L (ref 96–106)
Creatinine, Ser: 0.47 mg/dL — ABNORMAL LOW (ref 0.57–1.00)
GFR calc Af Amer: 148 mL/min/{1.73_m2} (ref >59)
GFR calc non Af Amer: 128 mL/min/{1.73_m2} (ref >59)
Globulin, Total: 2.5 g/dL (ref 1.5–4.5)
Glucose: 75 mg/dL (ref 65–99)
Potassium: 4 mmol/L (ref 3.5–5.2)
Sodium: 137 mmol/L (ref 134–144)
Total Protein: 6.3 g/dL (ref 6.0–8.5)

## 2019-11-04 LAB — CBC/D/PLT+RPR+RH+ABO+RUB AB...
Antibody Screen: NEGATIVE
Basophils Absolute: 0.1 10*3/uL (ref 0.0–0.2)
Basos: 1 %
EOS (ABSOLUTE): 0.3 10*3/uL (ref 0.0–0.4)
Eos: 2 %
HCV Ab: <0.1 s/co ratio (ref 0.0–0.9)
HIV Screen 4th Generation wRfx: NONREACTIVE
Hematocrit: 28.5 % — ABNORMAL LOW (ref 34.0–46.6)
Hemoglobin: 9.8 g/dL — ABNORMAL LOW (ref 11.1–15.9)
Hepatitis B Surface Ag: NEGATIVE
Immature Grans (Abs): 0.1 10*3/uL (ref 0.0–0.1)
Immature Granulocytes: 1 %
Lymphocytes Absolute: 2.3 10*3/uL (ref 0.7–3.1)
Lymphs: 16 %
MCH: 32.1 pg (ref 26.6–33.0)
MCHC: 34.4 g/dL (ref 31.5–35.7)
MCV: 93 fL (ref 79–97)
Monocytes Absolute: 0.9 10*3/uL (ref 0.1–0.9)
Monocytes: 6 %
Neutrophils Absolute: 10.5 10*3/uL — ABNORMAL HIGH (ref 1.4–7.0)
Neutrophils: 74 %
Platelets: 337 10*3/uL (ref 150–450)
RBC: 3.05 x10E6/uL — ABNORMAL LOW (ref 3.77–5.28)
RDW: 12.4 % (ref 11.7–15.4)
RPR Ser Ql: NONREACTIVE
Rh Factor: POSITIVE
Rubella Antibodies, IGG: 4.73 index (ref >0.99)
WBC: 14.2 10*3/uL — ABNORMAL HIGH (ref 3.4–10.8)

## 2019-11-04 LAB — PROTEIN / CREATININE RATIO, URINE
Creatinine, Urine: 222.3 mg/dL
Protein, Ur: 37 mg/dL
Protein/Creat Ratio: 166 mg/g creat (ref 0–200)

## 2019-11-04 LAB — HCV INTERPRETATION

## 2019-11-04 LAB — TSH: TSH: 1.02 u[IU]/mL (ref 0.450–4.500)

## 2019-11-05 LAB — URINE CULTURE, OB REFLEX

## 2019-11-05 LAB — CULTURE, OB URINE

## 2019-11-06 ENCOUNTER — Telehealth (INDEPENDENT_AMBULATORY_CARE_PROVIDER_SITE_OTHER): Payer: Medicaid Other | Admitting: Lactation Services

## 2019-11-06 DIAGNOSIS — A599 Trichomoniasis, unspecified: Secondary | ICD-10-CM | POA: Insufficient documentation

## 2019-11-06 DIAGNOSIS — O99019 Anemia complicating pregnancy, unspecified trimester: Secondary | ICD-10-CM | POA: Insufficient documentation

## 2019-11-06 DIAGNOSIS — O99012 Anemia complicating pregnancy, second trimester: Secondary | ICD-10-CM

## 2019-11-06 DIAGNOSIS — D509 Iron deficiency anemia, unspecified: Secondary | ICD-10-CM | POA: Insufficient documentation

## 2019-11-06 MED ORDER — ONDANSETRON 4 MG PO TBDP: 4.0000 mg | ORAL_TABLET | Freq: Once | ORAL | 0 refills | Status: AC

## 2019-11-06 MED ORDER — FLUCONAZOLE 150 MG PO TABS: 150.0000 mg | ORAL_TABLET | Freq: Once | ORAL | 0 refills | Status: AC

## 2019-11-06 MED ORDER — METRONIDAZOLE 500 MG PO TABS: ORAL_TABLET | ORAL | 0 refills | Status: DC

## 2019-11-06 NOTE — Progress Notes (Signed)
OB Telephone Note Patient called and told about trich, yeast and pills sent in and for partners to get tested/treated  I also told her we will set up iv iron for her.

## 2019-11-06 NOTE — Telephone Encounter (Signed)
Scheduled Feraheme infusion for 10/22 at 1 pm.   Called patient to inform her of Feraheme infusion date and time. Informed her to report to main entrance of [facility] and report to admitting about 15 minutes prior to infusion.   Patient asked if she can bring a support person. Gave her phone number to Short Stay to call and ask them.   Patient voiced understanding. Patient with no other questions or concerns. Patient to call as needed.

## 2019-11-06 NOTE — Telephone Encounter (Signed)
Called Short Stay Unit to schedule Feraheme infusion. LM for Short Stay Unit to call the office to schedule.

## 2019-11-06 NOTE — Addendum Note (Signed)
Addended by: Rupert Bing on: 11/06/2019 08:59 AM   Modules accepted: Orders

## 2019-11-06 NOTE — Telephone Encounter (Signed)
-----   Message from Laurel Bing, MD sent at 11/06/2019  8:59 AM EDT ----- Please set up iv iron for her. Pt already aware of her anemia. Orders are in. Thanks

## 2019-11-07 LAB — CYTOLOGY - PAP
Comment: NEGATIVE
Comment: NEGATIVE
Diagnosis: NEGATIVE
HPV 16: NEGATIVE
HPV 18 / 45: NEGATIVE
High risk HPV: POSITIVE — AB

## 2019-11-10 NOTE — BH Specialist Note (Signed)
Integrated Behavioral Health Initial Visit  MRN: [id number] Name: Bonnie Mccoy  Number of Integrated Behavioral Health Clinician visits:: 1/6 Session Start time: 3:19  Session End time: 4:50 Total time: 31  Type of Service: Integrated Behavioral Health- Individual/Family Interpretor:No. Interpretor Name and Language: n/a   Warm Hand Off Completed.       SUBJECTIVE: Bonnie Mccoy is a 34 y.o. female accompanied by n/a Patient was referred by London Bing, MD for depression. Patient reports the following symptoms/concerns: Pt states her primary concern today is experiencing extreme trauma due to local agency (agency currently under investigation for Medicaid fraud/unethical practices); agency wanted clients to take substances to qualify for housing or risk homelessness. Pt's goal is to have a healthy pregnancy with limited medical interventions, due to distrust and fear of needles and unnecessary medical procedures.  Duration of problem: Ongoing; Severity of problem: moderately severe  OBJECTIVE: Mood: Anxious and Affect: Appropriate and Tearful Risk of harm to self or others: No plan to harm self or others  LIFE CONTEXT: Family and Social: Pt lives with FOB and children (14,11,8); 19yo son in college out-of-state School/Work: - Self-Care: Marijuana to help with appetite in pregnancy Life Changes: Current pregnancy; experienced traumatic events after moving to [place]  GOALS ADDRESSED: Patient will: 1. Reduce symptoms of: anxiety, depression, mood instability and stress 2. Increase knowledge and/or ability of: healthy habits, self-management skills and stress reduction  3. Demonstrate ability to: Increase healthy adjustment to current life circumstances, Increase adequate support systems for patient/family and Increase motivation to adhere to plan of care  INTERVENTIONS: Interventions utilized: Solution-Focused Strategies and Psychoeducation and/or Health Education  Standardized  Assessments completed: GAD-7 and PHQ 9  ASSESSMENT: Patient currently experiencing Post-traumatic stress disorder and Psychosocial stress.   Patient may benefit from psychoeducation and brief therapeutic interventions regarding coping with symptoms of anxiety, depression and stress .  PLAN: 1. Follow up with behavioral health clinician on : Two weeks 2. Behavioral recommendations:  -Continue taking prenatal vitamin and iron as prescribed -Consider using Calm app again (or Stop, Breathe and Think) as additional self-care -Read educational materials regarding coping with symptoms of anxiety with panic -Use [facility] as needed (will discuss referral further at next visit) -Take home bag from [employer] today; use as needed 3. Referral(s): Integrated [employer] [facility] (In Clinic)  [address] [place], [place]   Depression screen [facility] 2/9 11/06/2019 11/03/2019 10/30/2019  Decreased Interest 2 2 1   Down, Depressed, Hopeless 2 2 2   PHQ - 2 Score 4 4 3   Altered sleeping 2 2 0  Tired, decreased energy 2 2 3   Change in appetite 2 2 3   Feeling bad or failure about yourself  0 0 0  Trouble concentrating 1 1 1   Moving slowly or fidgety/restless 0 0 0  Suicidal thoughts 0 0 0  PHQ-9 Score 11 11 10    GAD 7 : Generalized Anxiety Score 11/06/2019 11/03/2019 10/30/2019  Nervous, Anxious, on Edge 0 0 1  Control/stop worrying 0 0 0  Worry too much - different things 1 1 0  Trouble relaxing 0 0 0  Restless 0 0 0  Easily annoyed or irritable 2 2 1   Afraid - awful might happen 0 0 0  Total GAD 7 Score 3 3 2

## 2019-11-11 ENCOUNTER — Encounter: Payer: Self-pay | Admitting: Obstetrics and Gynecology

## 2019-11-11 DIAGNOSIS — B977 Papillomavirus as the cause of diseases classified elsewhere: Secondary | ICD-10-CM | POA: Insufficient documentation

## 2019-11-11 LAB — DRUG SCREEN 764883 11+OXYCO+ALC+CRT-BUND
Amphetamines, Urine: NEGATIVE ng/mL
BENZODIAZ UR QL: NEGATIVE ng/mL
Barbiturate: NEGATIVE ng/mL
Cocaine (Metabolite): NEGATIVE ng/mL
Creatinine: 236.2 mg/dL (ref 20.0–300.0)
Ethanol: NEGATIVE %
Meperidine: NEGATIVE ng/mL
Methadone Screen, Urine: NEGATIVE ng/mL
OPIATE SCREEN URINE: NEGATIVE ng/mL
Oxycodone/Oxymorphone, Urine: NEGATIVE ng/mL
Phencyclidine: NEGATIVE ng/mL
Propoxyphene: NEGATIVE ng/mL
Tramadol: NEGATIVE ng/mL
pH, Urine: 6.2 (ref 4.5–8.9)

## 2019-11-11 LAB — CANNABINOID CONFIRMATION, UR
CANNABINOIDS: POSITIVE — AB
Carboxy THC GC/MS Conf: >750 ng/mL

## 2019-11-13 ENCOUNTER — Encounter: Payer: Self-pay | Admitting: General Practice

## 2019-11-13 NOTE — Discharge Instructions (Signed)

## 2019-11-14 ENCOUNTER — Inpatient Hospital Stay (HOSPITAL_COMMUNITY)
Admission: RE | Admit: 2019-11-14 | Discharge: 2019-11-14 | Disposition: A | Discharge status: Home / Self Care | Payer: Medicaid Other | Source: Ambulatory Visit | Attending: Obstetrics and Gynecology | Admitting: Obstetrics and Gynecology

## 2019-11-14 ENCOUNTER — Other Ambulatory Visit: Payer: Self-pay | Admitting: *Deleted

## 2019-11-14 ENCOUNTER — Ambulatory Visit: Payer: Medicaid Other | Attending: Obstetrics and Gynecology

## 2019-11-14 ENCOUNTER — Encounter: Payer: Self-pay | Admitting: *Deleted

## 2019-11-14 ENCOUNTER — Encounter (HOSPITAL_COMMUNITY): Payer: Self-pay

## 2019-11-14 ENCOUNTER — Ambulatory Visit: Payer: Medicaid Other | Admitting: *Deleted

## 2019-11-14 ENCOUNTER — Other Ambulatory Visit: Payer: Self-pay

## 2019-11-14 ENCOUNTER — Other Ambulatory Visit: Payer: Self-pay | Admitting: Obstetrics and Gynecology

## 2019-11-14 DIAGNOSIS — O0932 Supervision of pregnancy with insufficient antenatal care, second trimester: Secondary | ICD-10-CM | POA: Diagnosis not present

## 2019-11-14 DIAGNOSIS — O0990 Supervision of high risk pregnancy, unspecified, unspecified trimester: Secondary | ICD-10-CM

## 2019-11-14 DIAGNOSIS — O99332 Smoking (tobacco) complicating pregnancy, second trimester: Secondary | ICD-10-CM

## 2019-11-14 DIAGNOSIS — O09899 Supervision of other high risk pregnancies, unspecified trimester: Secondary | ICD-10-CM

## 2019-11-14 DIAGNOSIS — F1990 Other psychoactive substance use, unspecified, uncomplicated: Secondary | ICD-10-CM

## 2019-11-14 DIAGNOSIS — Z3A23 23 weeks gestation of pregnancy: Secondary | ICD-10-CM

## 2019-11-14 DIAGNOSIS — O09522 Supervision of elderly multigravida, second trimester: Secondary | ICD-10-CM | POA: Diagnosis not present

## 2019-11-14 DIAGNOSIS — O30042 Twin pregnancy, dichorionic/diamniotic, second trimester: Secondary | ICD-10-CM

## 2019-11-14 DIAGNOSIS — O30009 Twin pregnancy, unspecified number of placenta and unspecified number of amniotic sacs, unspecified trimester: Secondary | ICD-10-CM

## 2019-11-17 ENCOUNTER — Encounter: Payer: Self-pay | Admitting: Obstetrics and Gynecology

## 2019-11-17 ENCOUNTER — Other Ambulatory Visit: Payer: Self-pay | Admitting: *Deleted

## 2019-11-17 ENCOUNTER — Ambulatory Visit (INDEPENDENT_AMBULATORY_CARE_PROVIDER_SITE_OTHER): Payer: Medicaid Other | Admitting: Clinical

## 2019-11-17 ENCOUNTER — Ambulatory Visit (INDEPENDENT_AMBULATORY_CARE_PROVIDER_SITE_OTHER): Payer: Medicaid Other | Admitting: Obstetrics & Gynecology

## 2019-11-17 ENCOUNTER — Encounter: Payer: Self-pay | Admitting: Obstetrics & Gynecology

## 2019-11-17 ENCOUNTER — Other Ambulatory Visit: Payer: Self-pay

## 2019-11-17 VITALS — BP 118/71 | HR 87 | Wt 174.0 lb

## 2019-11-17 DIAGNOSIS — O99012 Anemia complicating pregnancy, second trimester: Secondary | ICD-10-CM

## 2019-11-17 DIAGNOSIS — F4010 Social phobia, unspecified: Secondary | ICD-10-CM | POA: Diagnosis not present

## 2019-11-17 DIAGNOSIS — O30049 Twin pregnancy, dichorionic/diamniotic, unspecified trimester: Secondary | ICD-10-CM

## 2019-11-17 DIAGNOSIS — O0990 Supervision of high risk pregnancy, unspecified, unspecified trimester: Secondary | ICD-10-CM

## 2019-11-17 DIAGNOSIS — M549 Dorsalgia, unspecified: Secondary | ICD-10-CM

## 2019-11-17 DIAGNOSIS — Z658 Other specified problems related to psychosocial circumstances: Secondary | ICD-10-CM | POA: Diagnosis not present

## 2019-11-17 DIAGNOSIS — Z3A23 23 weeks gestation of pregnancy: Secondary | ICD-10-CM

## 2019-11-17 DIAGNOSIS — D509 Iron deficiency anemia, unspecified: Secondary | ICD-10-CM

## 2019-11-17 DIAGNOSIS — O99891 Other specified diseases and conditions complicating pregnancy: Secondary | ICD-10-CM

## 2019-11-17 DIAGNOSIS — O30042 Twin pregnancy, dichorionic/diamniotic, second trimester: Secondary | ICD-10-CM

## 2019-11-17 DIAGNOSIS — F4310 Post-traumatic stress disorder, unspecified: Secondary | ICD-10-CM

## 2019-11-17 MED ORDER — CYCLOBENZAPRINE HCL 10 MG PO TABS: 10.0000 mg | ORAL_TABLET | Freq: Three times a day (TID) | ORAL | 1 refills | Status: DC | PRN

## 2019-11-17 MED ORDER — COMFORT FIT MATERNITY SUPP LG MISC: 0 refills | Status: DC

## 2019-11-17 MED ORDER — ASPIRIN EC 81 MG PO TBEC: 81.0000 mg | DELAYED_RELEASE_TABLET | Freq: Every day | ORAL | 2 refills | Status: DC

## 2019-11-17 MED ORDER — FERROUS SULFATE 325 (65 FE) MG PO TABS: 325.0000 mg | ORAL_TABLET | Freq: Every day | ORAL | 3 refills | Status: DC

## 2019-11-17 NOTE — Patient Instructions (Addendum)
   PREGNANCY SUPPORT BELT: You are not alone, Seventy-five percent of women have some sort of abdominal or back pain at some point in their pregnancy. Your baby is growing at a fast pace, which means that your whole body is rapidly trying to adjust to the changes. As your uterus grows, your back may start feeling a bit under stress and this can result in back or abdominal pain that can go from mild, and therefore bearable, to severe pains that will not allow you to sit or lay down comfortably, When it comes to dealing with pregnancy-related pains and cramps, some pregnant women usually prefer natural remedies, which the market is filled with nowadays. For example, wearing a pregnancy support belt can help ease and lessen your discomfort and pain. WHAT ARE THE BENEFITS OF WEARING A PREGNANCY SUPPORT BELT? A pregnancy support belt provides support to the lower portion of the belly taking some of the weight of the growing uterus and distributing to the other parts of your body. It is designed make you comfortable and gives you extra support. Over the years, the pregnancy apparel market has been studying the needs and wants of pregnant women and they have come up with the most comfortable pregnancy support belts that woman could ever ask for. In fact, you will no longer have to wear a stretched-out or bulky pregnancy belt that is visible underneath your clothes and makes you feel even more uncomfortable. Nowadays, a pregnancy support belt is made of comfortable and stretchy materials that will not irritate your skin but will actually make you feel at ease and you will not even notice you are wearing it. They are easy to put on and adjust during the day and can be worn at night for additional support.  BENEFITS: . Relives Back pain . Relieves Abdominal Muscle and Leg Pain . Stabilizes the Pelvic Ring . Offers a Cushioned Abdominal Lift Pad . Relieves pressure on the Sciatic Nerve Within Minutes WHERE TO GET  YOUR PREGNANCY BELT: [employer] [phone] @[address] [place], [place] [address]    Return to office for any scheduled appointments. Call the office or go to the [facility] at [facility] if:  You begin to have strong, frequent contractions  Your water breaks.  Sometimes it is a big gush of fluid, sometimes it is just a trickle that keeps getting your panties wet or running down your legs  You have vaginal bleeding.  It is normal to have a small amount of spotting if your cervix was checked.   You do not feel your baby moving like normal.  If you do not, get something to eat and drink and lay down and focus on feeling your baby move.   If your baby is still not moving like normal, you should call the office or go to MAU.  Any other obstetric concerns. 

## 2019-11-17 NOTE — Patient Instructions (Addendum)
[facility] [place], [place] [address] [phone] (main office) [phone] ([place] office)  Coping with Panic Attacks   What is a panic attack?  You may have had a panic attack if you experienced four or more of the symptoms listed below coming on abruptly and peaking in about 10 minutes.  Panic Symptoms   . Pounding heart  . Sweating  . Trembling or shaking  . Shortness of breath  . Feeling of choking  . Chest pain  . Nausea or abdominal distress    . Feeling dizzy, unsteady, lightheaded, or faint  . Feelings of unreality or being detached from yourself  . Fear of losing control or going crazy  . Fear of dying  . Numbness or tingling  . Chills or hot flashes      Panic attacks are sometimes accompanied by avoidance of certain places or situations. These are often situations that would be difficult to escape from or in which help might not be available. Examples might include crowded shopping malls, public transportation, restaurants, or driving.   Why do panic attacks occur?   Panic attacks are the body's alarm system gone awry. All of [place] have a built-in alarm system, powered by adrenaline, which increases our heart rate, breathing, and blood flow in response to danger. Ordinarily, this 'danger response system' works well. In some people, however, the response is either out of proportion to whatever stress is going on, or may come out of the blue without any stress at all.   For example, if you are walking in the woods and see a bear coming your way, a variety of changes occur in your body to prepare you to either fight the danger or flee from the situation. Your heart rate will increase to get more blood flow around your body, your breathing rate will quicken so that more oxygen is available, and your muscles will tighten in order to be ready to fight or run. You may feel nauseated as blood flow leaves your  stomach area and moves into your limbs. These bodily changes are all essential to helping you survive the dangerous situation. After the danger has passed, your body functions will begin to go back to normal. This is because your body also has a system for "recovering" by bringing your body back down to a normal state when the danger is over.   As you can see, the emergency response system is adaptive when there is, in fact, a "true" or "real" danger (e.g., bear). However, sometimes people find that their emergency response system is triggered in "everyday" situations where there really is no true physical danger (e.g., in a meeting, in the grocery store, while driving in normal traffic, etc.).   What triggers a panic attack?  Sometimes particularly stressful situations can trigger a panic attack. For example, an argument with your spouse or stressors at work can cause a stress response (activating the emergency response system) because you perceive it as threatening or overwhelming, even if there is no direct risk to your survival.  Sometimes panic attacks don't seem to be triggered by anything in particular- they may "come out of the blue". Somehow, the natural "fight or flight" emergency response system has gotten activated when there is no real danger. Why does the body go into "emergency mode" when there is no real danger?   Often, people with panic attacks are frightened or alarmed by the physical sensations of  the emergency response system. First, unexpected physical sensations are experienced (tightness in your chest or some shortness of breath). This then leads to feeling fearful or alarmed by these symptoms ("Something's wrong!", "Am I having a heart attack?", "Am I going to faint?") The mind perceives that there is a danger even though no real danger exists. This, in turn, activates the emergency response system ("fight or flight"), leading to a "full blown" panic attack. In summary, panic  attacks occur when we misinterpret physical symptoms as signs of impending death, craziness, loss of control, embarrassment, or fear of fear. Sometimes you may be aware of thoughts of danger that activate the emergency response system (for example, thinking "I'm having a heart attack" when you feel chest pressure or increased heart rate). At other times, however, you may not be aware of such thoughts. After several incidences of being afraid of physical sensations, anxiety and panic can occur in response to the initial sensations without conscious thoughts of danger. Instead, you just feel afraid or alarmed. In other words, the panic or fear may seem to occur "automatically" without you consciously telling yourself anything.   After having had one or more panic attacks, you may also become more focused on what is going on inside your body. You may scan your body and be more vigilant about noticing any symptoms that might signal the start of a panic attack. This makes it easier for panic attacks to happen again because you pick up on sensations you might otherwise not have noticed, and misinterpret them as something dangerous. A panic attack may then result.      How do I cope with panic attacks?  An important part of overcoming panic attacks involves re-interpreting your body's physical reactions and teaching yourself ways to decrease the physical arousal. This can be done through practicing the cognitive and behavioral interventions below.   Research has found that over half of people who have panic attacks show some signs of hyperventilation or overbreathing. This can produce initial sensations that alarm you and lead to a panic attack. Overbreathing can also develop as part of the panic attack and make the symptoms worse. When people hyperventilate, certain blood vessels in the body become narrower. In particular, the brain may get slightly less oxygen. This can lead to the symptoms of dizziness,  confusion, and lightheadedness that often occur during panic attacks. Other parts of the body may also get a bit less oxygen, which may lead to numbness or tingling in the hands or feet or the sensation of cold, clammy hands. It also may lead the heart to pump harder. Although these symptoms may be frightening and feel unpleasant, it is important to remember that hyperventilating is not dangerous. However, you can help overcome the unpleasantness of overbreathing by practicing Breathing Retraining.   Practice this basic technique three times a day, every day:  . Inhale. With your shoulders relaxed, inhale as slowly and deeply as you can while you count to six. If you can, use your diaphragm to fill your lungs with air.  . Hold. Keep the air in your lungs as you slowly count to four.  . Exhale. Slowly breath out as you count to six.  . Repeat. Do the inhale-hold-exhale cycle several times. Each time you do it, exhale for longer counts.  Like any new skill, Breathing Retraining requires practice. Try practicing this skill twice a day for several minutes. Initially, do not try this technique in specific situations or when you   become frightened or have a panic attack. Begin by practicing in a quiet environment to build up your skill level so that you can later use it in time of "emergency."   2. Decreasing Avoidance  Regardless of whether you can identify why you began having panic attacks or whether they seemed to come out of the blue, the places where you began having panic attacks often can become triggers themselves. It is not uncommon for individuals to begin to avoid the places where they have had panic attacks. Over time, the individual may begin to avoid more and more places, thereby decreasing their activities and often negatively impacting their quality of life. To break the cycle of avoidance, it is important to first identify the places or situations that are being avoided, and then to do some  "relearning."  To begin this intervention, first create a list of locations or situations that you tend to avoid. Then choose an avoided location or situation that you would like to target first. Now develop an "exposure hierarchy" for this situation or location. An "exposure hierarchy" is a list of actions that make you feel anxious in this situation. Order these actions from least to most anxiety-producing. It is often helpful to have the first item on your hierarchy involve thinking or imagining part of the feared/avoided situation.   Here is an example of an exposure hierarchy for decreasing avoidance of the grocery store. Note how it is ordered from the least amount of anxiety (at the top) to the most anxiety (at the bottom):  Marland Kitchen Think about going to the grocery store alone.  . Go to the grocery store with a friend or family member.  . Go to the grocery store alone to pick up a few small items (5-10 minutes in the store).  . Shopping for 10-20 minutes in the store alone.  . Doing the shopping for the week by myself (20-30 minutes in the store).   Your homework is to "expose" yourself to the lowest item on your hierarchy and use your breathing relaxation and coping statements (see below) to help you remain in the situation. Practice this several times during the upcoming week. Once you have mastered each item with minimal anxiety, move on to the next higher action on your list.   Cognitive Interventions  1. Identify your negative self-talk Anxious thoughts can increase anxiety symptoms and panic. The first step in changing anxious thinking is to identify your own negative, alarming self-talk. Some common alarming thoughts:  . I'm having a heart attack.            . I must be going crazy. . I think I'm dying. Marland Kitchen People will think I'm crazy. . I'm going to pass our.  . Oh no- here it comes.  . I can't stand this.  Peggye Form got to get out of here!  2. Use positive coping statements Changing or  disrupting a pattern of anxious thoughts by replacing them with more calming or supportive statements can help to divert a panic attack. Some common helpful coping statements:  . This is not an emergency.  . I don't like feeling this way, but I can accept it.  . I can feel like this and still be okay.  . This has happened before, and I was okay. I'll be okay this time, too.  . I can be anxious and still deal with this situation.         /Emotional Wellbeing Apps and  Websites Here are a few free apps meant to help you to help yourself.  To find, try searching on the internet to see if the app is offered on Apple/Android devices. If your first choice doesn't come up on your device, the good news is that there are many choices! Play around with different apps to see which ones are helpful to you.    Calm This is an app meant to help increase calm feelings. Includes info, strategies, and tools for tracking your feelings.      Calm Harm  This app is meant to help with self-harm. Provides many 5-minute or 15-min coping strategies for doing instead of hurting yourself.       Healthy Minds Health Minds is a problem-solving tool to help deal with emotions and cope with stress you encounter wherever you are.      MindShift This app can help people cope with anxiety. Rather than trying to avoid anxiety, you can make an important shift and face it.      MY3  MY3 features a support system, safety plan and resources with the goal of offering a tool to use in a time of need.       My Life My Voice  This mood journal offers a simple solution for tracking your thoughts, feelings and moods. Animated emoticons can help identify your mood.       Relax Melodies Designed to help with sleep, on this app you can mix sounds and meditations for relaxation.      Smiling Mind Smiling Mind is meditation made easy: it's a simple tool that helps put a smile on your mind.        Stop,  Breathe & Think  A friendly, simple guide for people through meditations for mindfulness and compassion.  Stop, Breathe and Think Kids Enter your current feelings and choose a "mission" to help you cope. Offers videos for certain moods instead of just sound recordings.       Team Orange The goal of this tool is to help teens change how they think, act, and react. This app helps you focus on your own good feelings and experiences.      The United Station[employer] Box The United Stationers Box (VHB) contains simple tools to help patients with coping, relaxation, distraction, and positive thinking.    Behavioral Health Resources:   What if I or someone I know is in crisis?  . If you are thinking about harming yourself or having thoughts of suicide, or if you know someone who is, seek help right away.  . Call your doctor or mental health care provider.  . Call 911 or go to a hospital emergency room to get immediate help, or ask a friend or family member to help you do these things; IF YOU ARE IN [place], YOU MAY GO TO WALK-IN URGENT CARE 24/7 at [facility] (see below)  . Call the [place] National Suicide Prevention Lifeline's toll-free, 24-hour hotline at 1-800-273-TALK 706 727 3809) or TTY: 1-800-799-4 TTY 936-657-3203) to talk to a trained counselor.  . If you are in crisis, make sure you are not left alone.   . If someone else is in crisis, make sure he or she is not left alone   24 Hour :   [facility]  [address] [place], [place] [address] [phone] or [phone] WALK-IN URGENT CARE 24/7  [facility] Mobile Crisis: [phone]  [place] National Suicide Hotline: 802-742-5279  [facility] Service of the [employer] (Domestic Violence, Rape & Victim Assistance)  [phone]  [facility]  [address], [place]  [address]   [phone] or [phone]   [facility]  [employer] Crisis Services: [phone] (8am-4pm) or [phone] (after hours)        [facility], [address] [place], [place]  [phone] Fax: [phone] [email or web address] *Interpreters available *Accepts all insurance and uninsured for Urgent Care needs *Accepts Medicaid and uninsured for outpatient treatment   [facility]   Mon-Fri: 8am-5pm [address], [place], [place] [phone](ULAGT); [phone](fax) [email or web address]  *Accepts Medicare  [facility] [place], Fri: 8am-4pm [address], [place], [place] [address] [phone] (phone); [phone] (fax) [email or web address]  *Accepts Medicare  [facility] Mon-Fri: 9am-5pm  [address], [place], [place] [phone] (phone); [phone]  [email or web address]  *Accepts Medicaid  [facility]ovita Kussmaul [facility] [address] [place], [place]  [phone] [email or web address]   [facility] of the [place], 8:30am-12pm/1pm-2:30pm [address] [place], [place] [phone] (phone); [phone] (fax) [email or web address].[email or web address]  *Accepts Medicaid, sliding-scale*Bilingual services available  [facility] Mon-Fri, 8am-7pm [address] [place], [place]  [phone](YMEBR); [phone](fax) [email or web address].[email or web address]  *Accepts Medicaid *Bilingual services available  [facility] Mon-Fri: 8am-5pm, Saturday by appointment only [address] [place], [place], [place] [phone] (phone); [phone] (fax) [email or web address]   [facility] [address], [place], [place] [phone] [email or web address]  *Free & reduced services for uninsured and underinsured individuals *Bilingual services for Spanish-speaking clients 21 and  under  [employer] [facility], [address] [place], [place] [phone](OVANV); [phone](fax) [email or web address]  *Bring your own interpreter at first visit *Accepts Medicare and [facility]  [facility] Mon-Fri: 9am-5:30pm [address], [place], [place] [phone] (phone), [phone] (fax) After hours crisis line: [phone] [email or web address]  *Accepts Medicare and Medicaid  [employer], 8am-6pm [address] [place], [place]  [phone] (phone); [phone] (fax) [email or web address]  *Subsidized costs available  [facility] Mon-Fri: 8am-4pm [address] [place], [place] [phone](MYTRZ); [phone](fax) [email or web address]  *Accepts Medicaid  [facility] Same day access hours: Mon-Fri, 8:30-3pm Crisis hours: Mon-Fri, 8am-5pm [address] , [place] [phone]  [facility] [employer] Same day access hours: Mon-Fri, 8:30-3pm Crisis hours: Mon-Fri, 8am-8pm [address], [place], [place] [phone] (phone); [phone] (fax) [email or web address]  *Accepts Medicaid and Medicare  [facility] [place], [place], Fri: 9am-9pm Tues, Thurs: 9am-6pm [address] [place], [place], [place]  [phone] (phone); [phone] (fax) [email or web address]  *(Accepts Medicare and Medicaid; payment plans available)*Bilingual services available  [facility] [address], [place], [place] [phone] (phone); [phone] (fax) [email or web address]   [facility] [address], [place], [place]  [phone]  [email or web address]  *Bilingual services available  [facility]) Mon-Thurs: 8am-8pm [address], [place], [place] [phone] (phone);  [phone] (fax) [email or web address]  *Accepts Medicaid*Bilingual services available  [facility] [address] [place], [place] [phone] (phone) [email or web address]  *Accepts Medicaid *Bilingual services available  [facility] Mon-Fri, 9am-4:45pm [address] [place], [place] [phone] (phone); [phone] (fax) [email or web address]  *Accepts Medicare  [facility] Mon-Thurs: 8:30-8pm, Fri: 8:30am-7pm [address] [place], [place] (3rd floor) [phone] (phone); [phone] (fax) [email or web address]  *Accepts Medicaid; income-based reduced rates available  [facility] Mon-Fri: 8am-5pm [address], [place], [place] [address] [phone] (phone); [phone] (  fax) [email or web address]  *Accepts Medicaid*Bilingual services available   [facility] [place])  [address], [place] [phone] [email or web address]  *Provides direct services to individuals in recovery from mental illness, including support groups, recovery skills classes, and one on one peer support  [employer](National Alliance on Mental Illness) [place] NAMI helpline: [phone]  [facility] [place] helpline: (443) [phone] [email or web address]://[email or web address]  *A community hub for information relating to local resources and services for the friends and families of individuals living alongside a mental health condition, as well as the individuals themselves. Classes and support groups also provided

## 2019-11-17 NOTE — Progress Notes (Signed)
PRENATAL VISIT NOTE  Subjective:  Bonnie Mccoy is a 34 y.o. [phone] at [redacted]w[redacted]d being seen today for ongoing prenatal care.  She is currently monitored for the following issues for this high-risk pregnancy and has Drug use; History of gonorrhea; History of chlamydia; Sciatica; Supervision of high risk pregnancy, antepartum; Depression; History of preterm delivery, currently pregnant; AMA (advanced maternal age) multigravida 35+; Late prenatal care affecting pregnancy; Dichorionic diamniotic twin pregnancy; Maternal iron deficiency anemia complicating pregnancy in second trimester; Trichimoniasis; and HPV (human papilloma virus) infection on their problem list.  Patient reports backache, desires maternity belt.  Contractions: Not present. Vag. Bleeding: None.  Movement: Present. Denies leaking of fluid.   The following portions of the patient's history were reviewed and updated as appropriate: allergies, current medications, past family history, past medical history, past social history, past surgical history and problem list.   Objective:   Vitals:   11/17/19 1438  BP: 118/71  Pulse: 87  Weight: 174 lb (78.9 kg)    Fetal Status: Fetal Heart Rate (bpm): 132/143   Movement: Present     General:  Alert, oriented and cooperative. Patient is in no acute distress.  Skin: Skin is warm and dry. No rash noted.   Cardiovascular: Normal heart rate noted  Respiratory: Normal respiratory effort, no problems with respiration noted  Abdomen: Soft, gravid, appropriate for gestational age.  Pain/Pressure: Present     Pelvic: Cervical exam deferred        Extremities: Normal range of motion.  Edema: Trace  Mental Status: Normal mood and affect. Normal behavior. Normal judgment and thought content.   [place] MFM OB Transvaginal  Result Date: 11/14/2019 ----------------------------------------------------------------------  OBSTETRICS REPORT                        (Signed Final 11/14/2019 09:38 pm)  ---------------------------------------------------------------------- Patient Info  ID #:       [id number]                          D.O.B.:  January 04, 1986 (33 yrs)  Name:       Bonnie Mccoy                     Visit Date: 11/14/2019 12:26 pm ---------------------------------------------------------------------- Performed By  Attending:        Lin Landsman      Ref. Address:      [address]                    [place]                                                              [place], [place]                                                              [address]  Performed By:     Eden Lathe BS      [facility]                                  [facility]  Referred By:      [facility] ---------------------------------------------------------------------- Orders  #  Description                           Code        Ordered By  1  [place] MFM OB DETAIL +14 WK               [id number]    CHARLIE PICKENS  2  [place] MFM OB DETAIL ADDL GEST            76811.02    CHARLIE PICKENS     +14 WK  3  [place] MFM OB TRANSVAGINAL                [id number]     CHARLIE PICKENS ----------------------------------------------------------------------  #  Order #                     Accession #                Episode #  1  [id number]                   [id number]                 [id number]  2  [id number]                   [id number]                 [id number]  3  [id number]                   [id number]                 [id number] ---------------------------------------------------------------------- Indications  Antenatal screening for malformations           Z36.3  Twin pregnancy, di/di, second trimester         O30.042  (NIPS pending)  Advanced maternal age multigravida 29,          O46.522  second trimester  Late prenatal care, second  trimester            O09.32  Smoking complicating pregnancy, second          O99.332  trimester  [redacted] weeks gestation of pregnancy  Z3A.23 ---------------------------------------------------------------------- Fetal Evaluation (Fetus A)  Num Of Fetuses:          2  Fetal Heart Rate(bpm):   152  Cardiac Activity:        Observed  Fetal Lie:               Maternal left side  Presentation:            Breech  Placenta:                Anterior Left  P. Cord Insertion:       Visualized  Membrane Desc:      Dividing Membrane seen  Amniotic Fluid  AFI FV:      Within normal limits                              Largest Pocket(cm)                              4. ---------------------------------------------------------------------- Biometry (Fetus A)  BPD:        57  mm     G. Age:  23w 3d         50  %    CI:        73.53   %    70 - 86                                                          FL/HC:       20.3  %    19.2 - 20.8  HC:      211.2  mm     G. Age:  23w 1d         31  %    HC/AC:       1.11       1.05 - 1.21  AC:      189.7  mm     G. Age:  23w 5d         56  %    FL/BPD:      75.1  %    71 - 87  FL:       42.8  mm     G. Age:  24w 0d         62  %    FL/AC:       22.6  %    20 - 24  HUM:      39.1  mm     G. Age:  24w 0d         56  %  CER:      25.7  mm     G. Age:  23w 2d         73  %  LV:        5.3  mm  CM:        4.5  mm  Est. FW:     626   gm     1 lb 6 oz     66  %     FW Discordancy     0 \ 12 % ---------------------------------------------------------------------- OB History  Blood Type:   O+  Gravidity:    9         Term:   3        Prem:   1        SAB:   1  TOP:          3       Ectopic:  0        Living: 4 ---------------------------------------------------------------------- Gestational Age (Fetus A)  LMP:           23w 2d        Date:  06/04/19                 EDD:   03/10/20  U/S Today:     23w 4d                                        EDD:   03/08/20  Best:          23w 2d     Det.  By:  LMP  (06/04/19)          EDD:   03/10/20 ---------------------------------------------------------------------- Anatomy (Fetus A)  Cranium:               Appears normal         LVOT:                   Appears normal  Cavum:                 Appears normal         Aortic Arch:            Appears normal  Ventricles:            Appears normal         Ductal Arch:            Appears normal  Choroid Plexus:        Appears normal         Diaphragm:              Appears normal  Cerebellum:            Appears normal         Stomach:                Appears normal, left                                                                        sided  Posterior Fossa:       Appears normal         Abdomen:                Appears normal  Nuchal Fold:           Not applicable (>20    Abdominal Wall:         Appears nml (cord                         wks GA)  insert, abd wall)  Face:                  Orbits nl; profile not Cord Vessels:           Appears normal (3                         well visualized                                vessel cord)  Lips:                  Appears normal         Kidneys:                Appear normal  Palate:                Not well visualized    Bladder:                Appears normal  Thoracic:              Appears normal         Spine:                  Appears normal  Heart:                 Not well visualized    Upper Extremities:      Appears normal  RVOT:                  Appears normal         Lower Extremities:      Visualized  Other:  VC, 3VV and 3VTV visualized. Fetus appears to be female. Left open          hand/5th digit visualized. ---------------------------------------------------------------------- Fetal Evaluation (Fetus B)  Num Of Fetuses:          2  Fetal Heart Rate(bpm):   140  Cardiac Activity:        Observed  Fetal Lie:               Maternal right side  Presentation:            Cephalic  Placenta:                Anterior  P. Cord Insertion:        Visualized  Membrane Desc:      Dividing Membrane seen  Amniotic Fluid  AFI FV:      Within normal limits                              Largest Pocket(cm)                              4. ---------------------------------------------------------------------- Biometry (Fetus B)  BPD:      53.2  mm     G. Age:  22w 1d         10  %    CI:        67.81   %    70 - 86  FL/HC:       18.3  %    19.2 - 20.8  HC:      206.7  mm     G. Age:  22w 5d         17  %    HC/AC:       1.09       1.05 - 1.21  AC:      189.5  mm     G. Age:  23w 5d         55  %    FL/BPD:      71.2  %    71 - 87  FL:       37.9  mm     G. Age:  22w 1d          9  %    FL/AC:       20.0  %    20 - 24  HUM:      37.2  mm     G. Age:  23w 0d         37  %  CER:      24.5  mm     G. Age:  22w 3d         49  %  LV:        4.3  mm  CM:        4.1  mm  Est. FW:     548   gm     1 lb 3 oz     26  %     FW Discordancy        12  % ---------------------------------------------------------------------- Gestational Age (Fetus B)  LMP:           23w 2d        Date:  06/04/19                 EDD:   03/10/20  U/S Today:     22w 5d                                        EDD:   03/14/20  Best:          23w 2d     Det. By:  LMP  (06/04/19)          EDD:   03/10/20 ---------------------------------------------------------------------- Anatomy (Fetus B)  Cranium:               Appears normal         LVOT:                   Appears normal  Cavum:                 Appears normal         Aortic Arch:            Appears normal  Ventricles:            Appears normal         Ductal Arch:            Appears normal  Choroid Plexus:        Appears normal         Diaphragm:              Appears normal  Cerebellum:  Appears normal         Stomach:                Appears normal, left                                                                        sided  Posterior Fossa:       Appears normal         Abdomen:                 Appears normal  Nuchal Fold:           Not applicable (>20    Abdominal Wall:         Not well visualized                         wks GA)  Face:                  Orbits nl; profile not Cord Vessels:           Appears normal (3                         well visualized                                vessel cord)  Lips:                  Appears normal         Kidneys:                Appear normal  Palate:                Not well visualized    Bladder:                Appears normal  Thoracic:              Appears normal         Spine:                  Appears normal  Heart:                 Not well visualized    Upper Extremities:      Appears normal  RVOT:                  Not well visualized    Lower Extremities:      Appears normal  Other:  Feet visualized. Right heel visualized. Fetus appears to be female. ---------------------------------------------------------------------- Cervix Uterus Adnexa  Cervix  Length:           4.31  cm.  Normal appearance by transvaginal scan  Uterus  No abnormality visualized.  Right Ovary  Not visualized.  Left Ovary  Not visualized.  Cul De Sac  No free fluid seen.  Adnexa  No abnormality visualized. ---------------------------------------------------------------------- Impression  Twin intrauterine pregnancy with features suggestive of  Diamniotic Dichorionic pregnancy.  Normal anatomy with good amniotic fluid and fetal movement  was observed in Twin A and B.  The potential risks associated with a twin gestation were  discussed.  This discussion included a review of the  increased risk of miscarriages, anomalies, preterm labor,  and/or delivery, malpresentation, delivery via cesarean  section, gestational diabetes, and/or preeclampsia.  With  regards to fetal risks, there is an increased risk for fetal  growth restriction of one or both twins, preterm labor, and  associated morbidity, and intrauterine fetal demise.  We recommend growth scans every 4 weeks starting at  24  weeks with the initation of weekly antenatal testing in the  form of twice weekly NST or weekly BPP should abnormal  fetal growth or intertwin discordance of greater than 20-25%  is noted.  Suboptimal anatomy was observed in both twin A and B as  documented above.  Lastly, we recommend initiating daily low dose aspirin  Genetic screening pending. ---------------------------------------------------------------------- Recommendations  Limited exam scheduled in 2 weeks to confirm chorionicity  Follow up growth in 4 weeks to complete fetal anatomy ----------------------------------------------------------------------               Lin Landsman, MD Electronically Signed Final Report   11/14/2019 09:38 pm ----------------------------------------------------------------------  [place] MFM OB DETAIL +14 WK  Result Date: 11/14/2019 ----------------------------------------------------------------------  OBSTETRICS REPORT                        (Signed Final 11/14/2019 09:38 pm) ---------------------------------------------------------------------- Patient Info  ID #:       [id number]                          D.O.B.:  1985-08-07 (33 yrs)  Name:       Bonnie Mccoy                     Visit Date: 11/14/2019 12:26 pm ---------------------------------------------------------------------- Performed By  Attending:        Lin Landsman      Ref. Address:      [address]                    [place]                                                              [place], [place]                                                              [address]  Performed By:     Eden Lathe BS      [facility]                    RD[facility] RVT                                  [facility]  Referred By:      [facility]  ---------------------------------------------------------------------- Orders  #  Description                           Code        Ordered By  1  [place] MFM OB DETAIL +14 WK               [id number]    CHARLIE PICKENS  2  [place] MFM OB DETAIL ADDL GEST            [id number]    CHARLIE PICKENS     +14 WK  3  [place] MFM OB TRANSVAGINAL                [id number]     CHARLIE PICKENS ----------------------------------------------------------------------  #  Order #                     Accession #                Episode #  1  [id number]                   [id number]                 [id number]  2  [id number]                   [id number]                 [id number]  3  [id number]                   [id number]                 [id number] ---------------------------------------------------------------------- Indications  Antenatal screening for malformations           Z36.3  Twin pregnancy, di/di, second trimester         O30.042  (NIPS pending)  Advanced maternal age multigravida 84,          O17.522  second trimester  Late prenatal care, second trimester            O09.32  Smoking complicating pregnancy, second          O99.332  trimester  [redacted] weeks gestation of pregnancy                 Z3A.23 ---------------------------------------------------------------------- Fetal Evaluation (Fetus A)  Num Of Fetuses:          2  Fetal Heart Rate(bpm):   152  Cardiac Activity:        Observed  Fetal Lie:               Maternal left side  Presentation:            Breech  Placenta:                Anterior Left  P. Cord Insertion:  Visualized  Membrane Desc:      Dividing Membrane seen  Amniotic Fluid  AFI FV:      Within normal limits                              Largest Pocket(cm)                              4. ---------------------------------------------------------------------- Biometry (Fetus A)  BPD:        57  mm     G. Age:  23w 3d         50  %    CI:        73.53   %    70 - 86                                                          FL/HC:        20.3  %    19.2 - 20.8  HC:      211.2  mm     G. Age:  23w 1d         31  %    HC/AC:       1.11       1.05 - 1.21  AC:      189.7  mm     G. Age:  23w 5d         56  %    FL/BPD:      75.1  %    71 - 87  FL:       42.8  mm     G. Age:  24w 0d         62  %    FL/AC:       22.6  %    20 - 24  HUM:      39.1  mm     G. Age:  24w 0d         56  %  CER:      25.7  mm     G. Age:  23w 2d         73  %  LV:        5.3  mm  CM:        4.5  mm  Est. FW:     626   gm     1 lb 6 oz     66  %     FW Discordancy     0 \ 12 % ---------------------------------------------------------------------- OB History  Blood Type:   O+  Gravidity:    9         Term:   3        Prem:   1        SAB:   1  TOP:          3       Ectopic:  0        Living: 4 ---------------------------------------------------------------------- Gestational Age (Fetus A)  LMP:           23w 2d        Date:  06/04/19  EDD:   03/10/20  U/S Today:     23w 4d                                        EDD:   03/08/20  Best:          23w 2d     Det. By:  LMP  (06/04/19)          EDD:   03/10/20 ---------------------------------------------------------------------- Anatomy (Fetus A)  Cranium:               Appears normal         LVOT:                   Appears normal  Cavum:                 Appears normal         Aortic Arch:            Appears normal  Ventricles:            Appears normal         Ductal Arch:            Appears normal  Choroid Plexus:        Appears normal         Diaphragm:              Appears normal  Cerebellum:            Appears normal         Stomach:                Appears normal, left                                                                        sided  Posterior Fossa:       Appears normal         Abdomen:                Appears normal  Nuchal Fold:           Not applicable (>20    Abdominal Wall:         Appears nml (cord                         wks GA)                                        insert, abd wall)  Face:                   Orbits nl; profile not Cord Vessels:           Appears normal (3                         well visualized  vessel cord)  Lips:                  Appears normal         Kidneys:                Appear normal  Palate:                Not well visualized    Bladder:                Appears normal  Thoracic:              Appears normal         Spine:                  Appears normal  Heart:                 Not well visualized    Upper Extremities:      Appears normal  RVOT:                  Appears normal         Lower Extremities:      Visualized  Other:  VC, 3VV and 3VTV visualized. Fetus appears to be female. Left open          hand/5th digit visualized. ---------------------------------------------------------------------- Fetal Evaluation (Fetus B)  Num Of Fetuses:          2  Fetal Heart Rate(bpm):   140  Cardiac Activity:        Observed  Fetal Lie:               Maternal right side  Presentation:            Cephalic  Placenta:                Anterior  P. Cord Insertion:       Visualized  Membrane Desc:      Dividing Membrane seen  Amniotic Fluid  AFI FV:      Within normal limits                              Largest Pocket(cm)                              4. ---------------------------------------------------------------------- Biometry (Fetus B)  BPD:      53.2  mm     G. Age:  22w 1d         10  %    CI:        67.81   %    70 - 86                                                          FL/HC:       18.3  %    19.2 - 20.8  HC:      206.7  mm     G. Age:  22w 5d         17  %    HC/AC:       1.09       1.05 - 1.21  AC:  189.5  mm     G. Age:  23w 5d         55  %    FL/BPD:      71.2  %    71 - 87  FL:       37.9  mm     G. Age:  22w 1d          9  %    FL/AC:       20.0  %    20 - 24  HUM:      37.2  mm     G. Age:  23w 0d         37  %  CER:      24.5  mm     G. Age:  22w 3d         49  %  LV:        4.3  mm  CM:        4.1  mm  Est. FW:     548   gm     1 lb 3 oz      26  %     FW Discordancy        12  % ---------------------------------------------------------------------- Gestational Age (Fetus B)  LMP:           23w 2d        Date:  06/04/19                 EDD:   03/10/20  U/S Today:     22w 5d                                        EDD:   03/14/20  Best:          23w 2d     Det. By:  LMP  (06/04/19)          EDD:   03/10/20 ---------------------------------------------------------------------- Anatomy (Fetus B)  Cranium:               Appears normal         LVOT:                   Appears normal  Cavum:                 Appears normal         Aortic Arch:            Appears normal  Ventricles:            Appears normal         Ductal Arch:            Appears normal  Choroid Plexus:        Appears normal         Diaphragm:              Appears normal  Cerebellum:            Appears normal         Stomach:                Appears normal, left  sided  Posterior Fossa:       Appears normal         Abdomen:                Appears normal  Nuchal Fold:           Not applicable (>20    Abdominal Wall:         Not well visualized                         wks GA)  Face:                  Orbits nl; profile not Cord Vessels:           Appears normal (3                         well visualized                                vessel cord)  Lips:                  Appears normal         Kidneys:                Appear normal  Palate:                Not well visualized    Bladder:                Appears normal  Thoracic:              Appears normal         Spine:                  Appears normal  Heart:                 Not well visualized    Upper Extremities:      Appears normal  RVOT:                  Not well visualized    Lower Extremities:      Appears normal  Other:  Feet visualized. Right heel visualized. Fetus appears to be female. ---------------------------------------------------------------------- Cervix Uterus  Adnexa  Cervix  Length:           4.31  cm.  Normal appearance by transvaginal scan  Uterus  No abnormality visualized.  Right Ovary  Not visualized.  Left Ovary  Not visualized.  Cul De Sac  No free fluid seen.  Adnexa  No abnormality visualized. ---------------------------------------------------------------------- Impression  Twin intrauterine pregnancy with features suggestive of  Diamniotic Dichorionic pregnancy.  Normal anatomy with good amniotic fluid and fetal movement  was observed in Twin A and B.  The potential risks associated with a twin gestation were  discussed.  This discussion included a review of the  increased risk of miscarriages, anomalies, preterm labor,  and/or delivery, malpresentation, delivery via cesarean  section, gestational diabetes, and/or preeclampsia.  With  regards to fetal risks, there is an increased risk for fetal  growth restriction of one or both twins, preterm labor, and  associated morbidity, and intrauterine fetal demise.  We recommend growth scans every 4 weeks starting at 24  weeks with the initation of weekly antenatal testing in the  form of twice weekly NST  or weekly BPP should abnormal  fetal growth or intertwin discordance of greater than 20-25%  is noted.  Suboptimal anatomy was observed in both twin A and B as  documented above.  Lastly, we recommend initiating daily low dose aspirin  Genetic screening pending. ---------------------------------------------------------------------- Recommendations  Limited exam scheduled in 2 weeks to confirm chorionicity  Follow up growth in 4 weeks to complete fetal anatomy ----------------------------------------------------------------------               Lin Landsman, MD Electronically Signed Final Report   11/14/2019 09:38 pm ----------------------------------------------------------------------  [place] MFM OB DETAIL ADDL GEST +14 WK  Result Date:  11/14/2019 ----------------------------------------------------------------------  OBSTETRICS REPORT                        (Signed Final 11/14/2019 09:38 pm) ---------------------------------------------------------------------- Patient Info  ID #:       [id number]                          D.O.B.:  1985-02-01 (33 yrs)  Name:       Bonnie Mccoy                     Visit Date: 11/14/2019 12:26 pm ---------------------------------------------------------------------- Performed By  Attending:        Lin Landsman      Ref. Address:      [address]                    [place]                                                              [place], [place]                                                              [address]  Performed By:     Eden Lathe BS      [facility]          [facility]                    RD[facility] RVT                                  [facility] at                                                              [facility]  Referred By:      [facility] ---------------------------------------------------------------------- Orders  #  Description                           Code        Ordered By  1  [place] MFM OB DETAIL +14 WK               [id number]    CHARLIE PICKENS  2  [place] MFM OB DETAIL ADDL GEST            [id number]    CHARLIE PICKENS     +14 WK  3  [place] MFM OB TRANSVAGINAL                [id number]     CHARLIE PICKENS ----------------------------------------------------------------------  #  Order #                     Accession #                Episode #  1  [id number]                   [id number]                 [id number]  2  [id number]                   [id number]                 [id number]  3  [id number]                   [id number]                 [id number] ---------------------------------------------------------------------- Indications  Antenatal screening for malformations           Z36.3  Twin pregnancy,  di/di, second trimester         O30.042  (NIPS pending)  Advanced maternal age multigravida 16,          O67.522  second trimester  Late prenatal care, second trimester            O09.32  Smoking complicating pregnancy, second          O99.332  trimester  [redacted] weeks gestation of pregnancy                 Z3A.23 ---------------------------------------------------------------------- Fetal Evaluation (Fetus A)  Num Of Fetuses:          2  Fetal Heart Rate(bpm):   152  Cardiac Activity:        Observed  Fetal Lie:               Maternal left side  Presentation:            Breech  Placenta:                Anterior Left  P. Cord Insertion:       Visualized  Membrane Desc:      Dividing Membrane seen  Amniotic Fluid  AFI FV:      Within normal limits                              Largest Pocket(cm)  4. ---------------------------------------------------------------------- Biometry (Fetus A)  BPD:        57  mm     G. Age:  23w 3d         50  %    CI:        73.53   %    70 - 86                                                          FL/HC:       20.3  %    19.2 - 20.8  HC:      211.2  mm     G. Age:  23w 1d         31  %    HC/AC:       1.11       1.05 - 1.21  AC:      189.7  mm     G. Age:  23w 5d         56  %    FL/BPD:      75.1  %    71 - 87  FL:       42.8  mm     G. Age:  24w 0d         62  %    FL/AC:       22.6  %    20 - 24  HUM:      39.1  mm     G. Age:  24w 0d         56  %  CER:      25.7  mm     G. Age:  23w 2d         73  %  LV:        5.3  mm  CM:        4.5  mm  Est. FW:     626   gm     1 lb 6 oz     66  %     FW Discordancy     0 \ 12 % ---------------------------------------------------------------------- OB History  Blood Type:   O+  Gravidity:    9         Term:   3        Prem:   1        SAB:   1  TOP:          3       Ectopic:  0        Living: 4 ---------------------------------------------------------------------- Gestational Age (Fetus A)  LMP:           23w 2d         Date:  06/04/19                 EDD:   03/10/20  U/S Today:     23w 4d                                        EDD:   03/08/20  Best:          23w 2d  Det. By:  LMP  (06/04/19)          EDD:   03/10/20 ---------------------------------------------------------------------- Anatomy (Fetus A)  Cranium:               Appears normal         LVOT:                   Appears normal  Cavum:                 Appears normal         Aortic Arch:            Appears normal  Ventricles:            Appears normal         Ductal Arch:            Appears normal  Choroid Plexus:        Appears normal         Diaphragm:              Appears normal  Cerebellum:            Appears normal         Stomach:                Appears normal, left                                                                        sided  Posterior Fossa:       Appears normal         Abdomen:                Appears normal  Nuchal Fold:           Not applicable (>20    Abdominal Wall:         Appears nml (cord                         wks GA)                                        insert, abd wall)  Face:                  Orbits nl; profile not Cord Vessels:           Appears normal (3                         well visualized                                vessel cord)  Lips:                  Appears normal         Kidneys:                Appear normal  Palate:                Not  well visualized    Bladder:                Appears normal  Thoracic:              Appears normal         Spine:                  Appears normal  Heart:                 Not well visualized    Upper Extremities:      Appears normal  RVOT:                  Appears normal         Lower Extremities:      Visualized  Other:  VC, 3VV and 3VTV visualized. Fetus appears to be female. Left open          hand/5th digit visualized. ---------------------------------------------------------------------- Fetal Evaluation (Fetus B)  Num Of Fetuses:          2  Fetal Heart Rate(bpm):   140  Cardiac  Activity:        Observed  Fetal Lie:               Maternal right side  Presentation:            Cephalic  Placenta:                Anterior  P. Cord Insertion:       Visualized  Membrane Desc:      Dividing Membrane seen  Amniotic Fluid  AFI FV:      Within normal limits                              Largest Pocket(cm)                              4. ---------------------------------------------------------------------- Biometry (Fetus B)  BPD:      53.2  mm     G. Age:  22w 1d         10  %    CI:        67.81   %    70 - 86                                                          FL/HC:       18.3  %    19.2 - 20.8  HC:      206.7  mm     G. Age:  22w 5d         17  %    HC/AC:       1.09       1.05 - 1.21  AC:      189.5  mm     G. Age:  23w 5d         55  %    FL/BPD:      71.2  %    71 - 87  FL:       37.9  mm     G. Age:  22w 1d  9  %    FL/AC:       20.0  %    20 - 24  HUM:      37.2  mm     G. Age:  23w 0d         37  %  CER:      24.5  mm     G. Age:  22w 3d         49  %  LV:        4.3  mm  CM:        4.1  mm  Est. FW:     548   gm     1 lb 3 oz     26  %     FW Discordancy        12  % ---------------------------------------------------------------------- Gestational Age (Fetus B)  LMP:           23w 2d        Date:  06/04/19                 EDD:   03/10/20  U/S Today:     22w 5d                                        EDD:   03/14/20  Best:          23w 2d     Det. By:  LMP  (06/04/19)          EDD:   03/10/20 ---------------------------------------------------------------------- Anatomy (Fetus B)  Cranium:               Appears normal         LVOT:                   Appears normal  Cavum:                 Appears normal         Aortic Arch:            Appears normal  Ventricles:            Appears normal         Ductal Arch:            Appears normal  Choroid Plexus:        Appears normal         Diaphragm:              Appears normal  Cerebellum:            Appears normal         Stomach:                 Appears normal, left                                                                        sided  Posterior Fossa:       Appears normal         Abdomen:  Appears normal  Nuchal Fold:           Not applicable (>20    Abdominal Wall:         Not well visualized                         wks GA)  Face:                  Orbits nl; profile not Cord Vessels:           Appears normal (3                         well visualized                                vessel cord)  Lips:                  Appears normal         Kidneys:                Appear normal  Palate:                Not well visualized    Bladder:                Appears normal  Thoracic:              Appears normal         Spine:                  Appears normal  Heart:                 Not well visualized    Upper Extremities:      Appears normal  RVOT:                  Not well visualized    Lower Extremities:      Appears normal  Other:  Feet visualized. Right heel visualized. Fetus appears to be female. ---------------------------------------------------------------------- Cervix Uterus Adnexa  Cervix  Length:           4.31  cm.  Normal appearance by transvaginal scan  Uterus  No abnormality visualized.  Right Ovary  Not visualized.  Left Ovary  Not visualized.  Cul De Sac  No free fluid seen.  Adnexa  No abnormality visualized. ---------------------------------------------------------------------- Impression  Twin intrauterine pregnancy with features suggestive of  Diamniotic Dichorionic pregnancy.  Normal anatomy with good amniotic fluid and fetal movement  was observed in Twin A and B.  The potential risks associated with a twin gestation were  discussed.  This discussion included a review of the  increased risk of miscarriages, anomalies, preterm labor,  and/or delivery, malpresentation, delivery via cesarean  section, gestational diabetes, and/or preeclampsia.  With  regards to fetal risks, there is an increased risk for fetal   growth restriction of one or both twins, preterm labor, and  associated morbidity, and intrauterine fetal demise.  We recommend growth scans every 4 weeks starting at 24  weeks with the initation of weekly antenatal testing in the  form of twice weekly NST or weekly BPP should abnormal  fetal growth or intertwin discordance of greater than 20-25%  is noted.  Suboptimal anatomy was observed in both twin A and B as  documented above.  Lastly, we  recommend initiating daily low dose aspirin  Genetic screening pending. ---------------------------------------------------------------------- Recommendations  Limited exam scheduled in 2 weeks to confirm chorionicity  Follow up growth in 4 weeks to complete fetal anatomy ----------------------------------------------------------------------               Lin Landsman, MD Electronically Signed Final Report   11/14/2019 09:38 pm ----------------------------------------------------------------------   Assessment and Plan:  Pregnancy: [id number] at [redacted]w[redacted]d 1. Dichorionic diamniotic twin pregnancy in second trimester Follow up scans as per MFM. Maternity belt prescribed. - Elastic Bandages & Supports (COMFORT FIT MATERNITY SUPP LG) MISC; Please use maternity belt as instructed  Dispense: 1 each; Refill: 0 - aspirin EC 81 MG tablet; Take 1 tablet (81 mg total) by mouth daily. Take after 12 weeks for prevention of preeclampsia later in pregnancy  Dispense: 300 tablet; Refill: 2  2. Back pain affecting pregnancy in second trimester Physical therapy referral done. Flexeril ordered. - Elastic Bandages & Supports (COMFORT FIT MATERNITY SUPP LG) MISC; Please use maternity belt as instructed  Dispense: 1 each; Refill: 0 - cyclobenzaprine (FLEXERIL) 10 MG tablet; Take 1 tablet (10 mg total) by mouth every 8 (eight) hours as needed for muscle spasms.  Dispense: 30 tablet; Refill: 1 - Ambulatory referral to Physical Therapy  3. Maternal iron deficiency anemia complicating  pregnancy in second trimester Declined iron infusion, oral iron therapy prescribed. - ferrous sulfate (FERROUSUL) 325 (65 FE) MG tablet; Take 1 tablet (325 mg total) by mouth daily with breakfast.  Dispense: 60 tablet; Refill: 3  4. [redacted] weeks gestation of pregnancy 5. Supervision of high risk pregnancy, antepartum Declines any alcohol and THC use in one month, she was congratulated, will see [facility] counselor today.  Preterm labor symptoms and general obstetric precautions including but not limited to vaginal bleeding, contractions, leaking of fluid and fetal movement were reviewed in detail with the patient. Please refer to After Visit Summary for other counseling recommendations.   Return in about 4 weeks (around 12/15/2019) for 2 hr GTT, 3rd trimester labs, TDap, OFFICE HOB Visit.  Future Appointments  Date Time Provider Department Center  11/17/2019  3:15 PM Peter Congo [facility]  11/28/2019  2:00 PM [facility] NURSE [facility]  11/28/2019  2:15 PM [facility] US2 [facility]  12/11/2019 10:30 AM [facility] NURSE [facility]  12/11/2019 10:45 AM [facility] US5 [facility]    Jaynie Collins, MD

## 2019-11-18 ENCOUNTER — Encounter: Payer: Self-pay | Admitting: General Practice

## 2019-11-18 NOTE — BH Specialist Note (Addendum)
Integrated Behavioral Health via Telemedicine Phone (Caregility) Visit  11/18/2019 [name] [id number]  Number of Integrated Behavioral Health visits: 2 Session Start time: 3:46  Session End time: 4:42 Total time: 56 minutes  Referring Provider: Catalina Antigua, MD Type of Service: Individual Patient/Family location: Home [facility] Provider location: [facility]  All persons participating in visit: Patient Bonnie Mccoy and Bonnie [facility] Herchel Hopkin     I connected with Bonnie Mccoy  by a video enabled telemedicine application (Caregility) and verified that I am speaking with the correct person using two identifiers.   Discussed confidentiality: Yes   Confirmed demographics & insurance:  Yes   I discussed that engaging in this virtual visit, they consent to the provision of behavioral healthcare and the services will be billed under their insurance.   Patient and/or legal guardian expressed understanding and consented to virtual visit: Yes   PRESENTING CONCERNS: Patient and/or family reports the following symptoms/concerns: Pt states her primary concern today is ongoing anxiety, lack of concentration, and financial stress (Medicaid no longer paying for maternity belt, $25; cost too prohibitive, never received stimulus money for children; spending  35 hours/week in job-search activity to be eligible to receive $200 in benefits, etc.). Pt agrees to referral to psychiatry for PTSD, ADHD and history of depression, for ongoing care prior to postpartum time.  Duration of problem: Ongoing; Severity of problem: moderately severe  STRENGTHS (Protective Factors/Coping Skills): Social connections and Parental Resilience  ASSESSMENT: Patient currently experiencing PTSD, ADHD, Psychosocial stress.    GOALS ADDRESSED: Patient will: 1.  Reduce symptoms of: anxiety, depression and stress  2.  Increase knowledge and/or ability of: stress reduction  3.   Demonstrate ability to: Increase healthy adjustment to current life circumstances and Increase motivation to adhere to plan of care   Progress of Goals: Revised  INTERVENTIONS: Interventions utilized:  Motivational Interviewing, Psychoeducation and/or Health Education and Link to [employer] Standardized Assessments completed & reviewed: PHQ9/GAD7 given in past two weeks   OUTCOME: Patient Response: Pt agrees to treatment plan   PLAN: 1. Follow up with behavioral health clinician on : Two weeks 2. Behavioral recommendations:  -Accept referral to psychiatry -Continue taking prenatal vitamin and iron as prescribed -Discuss options with medical provider at upcoming visit regarding maternity belt -Continue working with [employer] case manager regarding future benefits and work requirement options -Consider using apps, as discussed (on After Visit Summary) 3. Referral(s): Integrated Art gallery manager (In Clinic) and [facility] [facility] (LME/Outside Clinic)  I discussed the assessment and treatment plan with the patient and/or parent/guardian. They were provided an opportunity to ask questions and all were answered. They agreed with the plan and demonstrated an understanding of the instructions.   They were advised to call back or seek an in-person evaluation as appropriate.  I discussed that the purpose of this visit is to provide behavioral health care while limiting exposure to the novel coronavirus.  Discussed there is a possibility of technology failure and discussed alternative modes of communication if that failure occurs.  Bonnie Mccoy Bonnie Mccoy  Depression screen [facility] 2/9 11/17/2019 11/06/2019 11/03/2019 10/30/2019  Decreased Interest 1 2 2 1   Down, Depressed, Hopeless 1 2 2 2   PHQ - 2 Score 2 4 4 3   Altered sleeping 2 2 2  0  Tired, decreased energy 2 2 2 3   Change in appetite 2 2 2 3   Feeling bad or failure about yourself  0 0 0 0  Trouble concentrating  1 1 1 1   Moving slowly or fidgety/restless 0 0 0 0  Suicidal thoughts 0 0 0 0  PHQ-9 Score 9 11 11 10    GAD 7 : Generalized Anxiety Score 11/17/2019 11/06/2019 11/03/2019 10/30/2019  Nervous, Anxious, on Edge 2 0 0 1  Control/stop worrying 0 0 0 0  Worry too much - different things 1 1 1  0  Trouble relaxing 1 0 0 0  Restless 1 0 0 0  Easily annoyed or irritable 2 2 2 1   Afraid - awful might happen 1 0 0 0  Total GAD 7 Score 8 3 3  2

## 2019-11-19 ENCOUNTER — Telehealth: Payer: Self-pay | Admitting: Lactation Services

## 2019-11-19 NOTE — Telephone Encounter (Signed)
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   Patient advised to call Avelina Laine to discuss results and discuss if FOB needs to be tested also. Patient given phone number to call and set up Genetic Screening Telephone Consult.   Reviewed with Dr. Vergie Living prior to calling patient with results.

## 2019-11-24 ENCOUNTER — Encounter: Payer: Self-pay | Admitting: Obstetrics and Gynecology

## 2019-11-24 DIAGNOSIS — Z148 Genetic carrier of other disease: Secondary | ICD-10-CM | POA: Insufficient documentation

## 2019-11-28 ENCOUNTER — Other Ambulatory Visit: Payer: Self-pay

## 2019-11-28 ENCOUNTER — Other Ambulatory Visit: Payer: Self-pay | Admitting: Obstetrics & Gynecology

## 2019-11-28 ENCOUNTER — Ambulatory Visit: Payer: Medicaid Other | Admitting: *Deleted

## 2019-11-28 ENCOUNTER — Encounter: Payer: Self-pay | Admitting: *Deleted

## 2019-11-28 ENCOUNTER — Ambulatory Visit: Payer: Medicaid Other | Attending: Obstetrics and Gynecology

## 2019-11-28 ENCOUNTER — Other Ambulatory Visit: Payer: Self-pay | Admitting: *Deleted

## 2019-11-28 DIAGNOSIS — O0990 Supervision of high risk pregnancy, unspecified, unspecified trimester: Secondary | ICD-10-CM

## 2019-11-28 DIAGNOSIS — O09522 Supervision of elderly multigravida, second trimester: Secondary | ICD-10-CM

## 2019-11-28 DIAGNOSIS — O09899 Supervision of other high risk pregnancies, unspecified trimester: Secondary | ICD-10-CM | POA: Insufficient documentation

## 2019-11-28 DIAGNOSIS — O0932 Supervision of pregnancy with insufficient antenatal care, second trimester: Secondary | ICD-10-CM | POA: Diagnosis not present

## 2019-11-28 DIAGNOSIS — Z3A25 25 weeks gestation of pregnancy: Secondary | ICD-10-CM

## 2019-11-28 DIAGNOSIS — O99332 Smoking (tobacco) complicating pregnancy, second trimester: Secondary | ICD-10-CM | POA: Diagnosis not present

## 2019-11-28 DIAGNOSIS — Z363 Encounter for antenatal screening for malformations: Secondary | ICD-10-CM

## 2019-11-28 DIAGNOSIS — D649 Anemia, unspecified: Secondary | ICD-10-CM

## 2019-11-28 DIAGNOSIS — O99012 Anemia complicating pregnancy, second trimester: Secondary | ICD-10-CM

## 2019-11-28 DIAGNOSIS — O30032 Twin pregnancy, monochorionic/diamniotic, second trimester: Secondary | ICD-10-CM

## 2019-11-28 DIAGNOSIS — Z720 Tobacco use: Secondary | ICD-10-CM

## 2019-11-28 DIAGNOSIS — O30049 Twin pregnancy, dichorionic/diamniotic, unspecified trimester: Secondary | ICD-10-CM

## 2019-11-28 DIAGNOSIS — O30009 Twin pregnancy, unspecified number of placenta and unspecified number of amniotic sacs, unspecified trimester: Secondary | ICD-10-CM | POA: Diagnosis not present

## 2019-12-01 ENCOUNTER — Encounter: Payer: Self-pay | Admitting: *Deleted

## 2019-12-01 ENCOUNTER — Ambulatory Visit (INDEPENDENT_AMBULATORY_CARE_PROVIDER_SITE_OTHER): Payer: Medicaid Other | Admitting: Clinical

## 2019-12-01 DIAGNOSIS — O0990 Supervision of high risk pregnancy, unspecified, unspecified trimester: Secondary | ICD-10-CM | POA: Diagnosis not present

## 2019-12-01 DIAGNOSIS — F3289 Other specified depressive episodes: Secondary | ICD-10-CM | POA: Diagnosis not present

## 2019-12-01 DIAGNOSIS — F4310 Post-traumatic stress disorder, unspecified: Secondary | ICD-10-CM

## 2019-12-01 DIAGNOSIS — Z658 Other specified problems related to psychosocial circumstances: Secondary | ICD-10-CM

## 2019-12-01 NOTE — Patient Instructions (Signed)
[facility] [place] [facility] [address] [place], [place] [address] [phone] (main office) [phone] (Akil Hoos's office)  /Emotional Wellbeing Apps and Websites Here are a few free apps meant to help you to help yourself.  To find, try searching on the internet to see if the app is offered on Apple/Android devices. If your first choice doesn't come up on your device, the good news is that there are many choices! Play around with different apps to see which ones are helpful to you.    Calm This is an app meant to help increase calm feelings. Includes info, strategies, and tools for tracking your feelings.      Calm Harm  This app is meant to help with self-harm. Provides many 5-minute or 15-min coping strategies for doing instead of hurting yourself.       Healthy Minds Health Minds is a problem-solving tool to help deal with emotions and cope with stress you encounter wherever you are.      MindShift This app can help people cope with anxiety. Rather than trying to avoid anxiety, you can make an important shift and face it.      MY3  MY3 features a support system, safety plan and resources with the goal of offering a tool to use in a time of need.       My Life My Voice  This mood journal offers a simple solution for tracking your thoughts, feelings and moods. Animated emoticons can help identify your mood.       Relax Melodies Designed to help with sleep, on this app you can mix sounds and meditations for relaxation.      Smiling Mind Smiling Mind is meditation made easy: it's a simple tool that helps put a smile on your mind.        Stop, Breathe & Think  A friendly, simple guide for people through meditations for mindfulness and compassion.  Stop, Breathe and Think Kids Enter your current feelings and choose a "mission" to help you cope. Offers videos for certain moods instead of just sound recordings.       Team  Orange The goal of this tool is to help teens change how they think, act, and react. This app helps you focus on your own good feelings and experiences.      The Virtual Hope Box The Virtual Hope Box (VHB) contains simple tools to help patients with coping, relaxation, distraction, and positive thinking.     

## 2019-12-09 NOTE — BH Specialist Note (Signed)
Integrated Behavioral Health via Telemedicine Video (Caregility) Visit  12/09/2019 Danya Spearman [id number]  Number of Integrated Behavioral Health visits: 3 Session Start time: 8:15  Session End time: 8:49 Total time: 34 minutes  Referring Provider: Catalina Antigua, MD Type of Service: Individual Patient/Family location: Home [facility] Provider [facility]: [facility]  All persons participating in visit: Patient Bonnie [facility] and [facility] Mariely Mahr    I connected with [name] by a video enabled telemedicine application (Caregility) and verified that I am speaking with the correct person using two identifiers.   Discussed confidentiality: Yes   Confirmed demographics & insurance:  Yes   I discussed that engaging in this virtual visit, they consent to the provision of behavioral healthcare and the services will be billed under their insurance.   Patient and/or legal guardian expressed understanding and consented to virtual visit: Yes   PRESENTING CONCERNS: Patient and/or family reports the following symptoms/concerns: Pt states her primary concern today is heartburn, making sleep difficult, along with worry about possibility of having cesarean or epidural if baby continues in breech position; first time she has a dog and open to tips about safe baby/dog introduction. Pt is using meditation music and self-talk to cope with worry.  Duration of problem: Current pregnancy; Severity of problem: moderately severe  STRENGTHS (Protective Factors/Coping Skills): Social connections and Parental Resilience  ASSESSMENT: Patient currently experiencing PTSD, ADHD, Psychosocial stress.    GOALS ADDRESSED: Patient will: 1.  Reduce symptoms of: anxiety, depression and stress  2.  Increase knowledge and/or ability of: healthy habits and stress reduction  3.  Demonstrate ability to: Increase healthy adjustment to current life circumstances    Progress of Goals: Ongoing  INTERVENTIONS: Interventions utilized:  Solution-Focused Strategies Standardized Assessments completed & reviewed: Not Needed   OUTCOME: Patient Response: Pt agrees to treatment plan   PLAN: 1. Follow up with behavioral health clinician on : Three weeks 2. Behavioral recommendations:  -Continue to accept referral to psychiatry -Continue taking prenatal vitamin and iron as prescribed -At visit with medical provider on 12/16/19, discuss options for pain in childbirth (other than epidural), as well as any available options to turn breech baby; additional coping with heartburn -If continuing to drink orange juice, consider switching to low-acid OJ (may or may not help); continue to limit soda drinks - Consider [employer] site for tips for safe baby/dog interaction -Continue using meditation music and self-talk to manage emotions during pregnancy 3. Referral(s): Integrated Art gallery manager (In Clinic), [facility] (LME/Outside Clinic) and Community Resources:  dog/baby interactions  I discussed the assessment and treatment plan with the patient and/or parent/guardian. They were provided an opportunity to ask questions and all were answered. They agreed with the plan and demonstrated an understanding of the instructions.   They were advised to call back or seek an in-person evaluation as appropriate.  I discussed that the purpose of this visit is to provide behavioral health care while limiting exposure to the novel coronavirus.  Discussed there is a possibility of technology failure and discussed alternative modes of communication if that failure occurs.  Valetta Close Mirtie Bastyr  Depression screen [facility] 2/9 11/17/2019 11/06/2019 11/03/2019 10/30/2019  Decreased Interest 1 2 2 1   Down, Depressed, Hopeless 1 2 2 2   PHQ - 2 Score 2 4 4 3   Altered sleeping 2 2 2  0  Tired, decreased energy 2 2 2 3   Change in appetite 2 2 2  3  Feeling bad or failure about yourself  0 0 0 0  Trouble concentrating 1 1 1 1   Moving slowly or fidgety/restless 0 0 0 0  Suicidal thoughts 0 0 0 0  PHQ-9 Score 9 11 11 10    GAD 7 : Generalized Anxiety Score 11/17/2019 11/06/2019 11/03/2019 10/30/2019  Nervous, Anxious, on Edge 2 0 0 1  Control/stop worrying 0 0 0 0  Worry too much - different things 1 1 1  0  Trouble relaxing 1 0 0 0  Restless 1 0 0 0  Easily annoyed or irritable 2 2 2 1   Afraid - awful might happen 1 0 0 0  Total GAD 7 Score 8 3 3  2

## 2019-12-11 ENCOUNTER — Encounter: Payer: Self-pay | Admitting: *Deleted

## 2019-12-11 ENCOUNTER — Ambulatory Visit: Payer: Medicaid Other | Attending: Obstetrics and Gynecology

## 2019-12-11 ENCOUNTER — Other Ambulatory Visit: Payer: Self-pay

## 2019-12-11 ENCOUNTER — Ambulatory Visit: Payer: Medicaid Other | Admitting: *Deleted

## 2019-12-11 DIAGNOSIS — O09899 Supervision of other high risk pregnancies, unspecified trimester: Secondary | ICD-10-CM | POA: Diagnosis present

## 2019-12-11 DIAGNOSIS — O30049 Twin pregnancy, dichorionic/diamniotic, unspecified trimester: Secondary | ICD-10-CM | POA: Insufficient documentation

## 2019-12-11 DIAGNOSIS — O0990 Supervision of high risk pregnancy, unspecified, unspecified trimester: Secondary | ICD-10-CM

## 2019-12-11 DIAGNOSIS — O99012 Anemia complicating pregnancy, second trimester: Secondary | ICD-10-CM

## 2019-12-11 DIAGNOSIS — O30042 Twin pregnancy, dichorionic/diamniotic, second trimester: Secondary | ICD-10-CM | POA: Diagnosis not present

## 2019-12-11 DIAGNOSIS — Z720 Tobacco use: Secondary | ICD-10-CM

## 2019-12-11 DIAGNOSIS — O99332 Smoking (tobacco) complicating pregnancy, second trimester: Secondary | ICD-10-CM

## 2019-12-11 DIAGNOSIS — O321XX1 Maternal care for breech presentation, fetus 1: Secondary | ICD-10-CM | POA: Diagnosis not present

## 2019-12-11 DIAGNOSIS — Z363 Encounter for antenatal screening for malformations: Secondary | ICD-10-CM

## 2019-12-11 DIAGNOSIS — Z3A27 27 weeks gestation of pregnancy: Secondary | ICD-10-CM

## 2019-12-11 DIAGNOSIS — O09522 Supervision of elderly multigravida, second trimester: Secondary | ICD-10-CM

## 2019-12-11 DIAGNOSIS — O0932 Supervision of pregnancy with insufficient antenatal care, second trimester: Secondary | ICD-10-CM

## 2019-12-11 DIAGNOSIS — Z148 Genetic carrier of other disease: Secondary | ICD-10-CM

## 2019-12-11 DIAGNOSIS — D649 Anemia, unspecified: Secondary | ICD-10-CM

## 2019-12-15 ENCOUNTER — Ambulatory Visit (INDEPENDENT_AMBULATORY_CARE_PROVIDER_SITE_OTHER): Payer: Medicaid Other | Admitting: Clinical

## 2019-12-15 DIAGNOSIS — Z658 Other specified problems related to psychosocial circumstances: Secondary | ICD-10-CM

## 2019-12-15 DIAGNOSIS — F909 Attention-deficit hyperactivity disorder, unspecified type: Secondary | ICD-10-CM

## 2019-12-15 DIAGNOSIS — F4310 Post-traumatic stress disorder, unspecified: Secondary | ICD-10-CM | POA: Diagnosis not present

## 2019-12-15 NOTE — Patient Instructions (Addendum)
Center for [facility] at [facility] [place], [place] [address] [phone] (main office) [phone] [facility] office)  [email or web address] ("Dogs&Storks, preparing families with dogs for life with baby")     BRAINSTORMING  Develop a Plan Goals: . Provide a way to start conversation about your new life with a baby . Assist parents in recognizing and using resources within their reach . Help pave the way before birth for an easier period of transition afterwards.  Make a list of the following information to keep in a central location: . Full name of Mom and Partner: _____________________________________________ . 12 full name and Date of Birth: ___________________________________________ . Home Address: ___________________________________________________________ ________________________________________________________________________ . Home Phone: ____________________________________________________________ . Parents' cell numbers: _____________________________________________________ ________________________________________________________________________ . Name and contact info for OB: ______________________________________________ . Name and contact info for Pediatrician:________________________________________ . Contact info for Lactation Consultants: ________________________________________  REST and SLEEP *You each need at least 4-5 hours of uninterrupted sleep every day. Write specific names and contact information.* . How are you going to rest in the postpartum period? While partner's home? When partner returns to work? When you both return to work? Marland Kitchen Where will your baby sleep? Marland Kitchen Who is available to help during the day? Evening? Night? . Who could move in for a period to help support you? Marland Kitchen What are some ideas to help you get enough  sleep? __________________________________________________________________________________________________________________________________________________________________________________________________________________________________________ NUTRITIOUS FOOD AND DRINK *Plan for meals before your baby is born so you can have healthy food to eat during the immediate postpartum period.* . Who will look after breakfast? Lunch? Dinner? List names and contact information. Brainstorm quick, healthy ideas for each meal. . What can you do before baby is born to prepare meals for the postpartum period? . How can others help you with meals? Marland Kitchen Which grocery stores provide online shopping and delivery? Marland Kitchen Which restaurants offer take-out or delivery options? ______________________________________________________________________________________________________________________________________________________________________________________________________________________________________________________________________________________________________________________________________________________________________________________________________  CARE FOR MOM *It's important that mom is cared for and pampered in the postpartum period. Remember, the most important ways new mothers need care are: sleep, nutrition, gentle exercise, and time off.* . Who can come take care of mom during this period? Make a list of people with their contact information. . List some activities that make you feel cared for, rested, and energized? Who can make sure you have opportunities to do these things? . Does mom have a space of her very own within your home that's just for her? Make a "[facility]" where she can be comfortable, rest, and renew herself  daily. ______________________________________________________________________________________________________________________________________________________________________________________________________________________________________________________________________________________________________________________________________________________________________________________________________    CARE FOR AND FEEDING BABY *Knowledgeable and encouraging people will offer the best support with regard to feeding your baby.* . Educate yourself and choose the best feeding option for your baby. . Make a list of people who will guide, support, and be a resource for you as your care for and feed your baby. (Friends that have breastfed or are currently breastfeeding, lactation consultants, breastfeeding support groups, etc.) . Consider a postpartum doula. (These websites can give you information: dona.org & BuyingShow.es) . Seek out local breastfeeding resources like the breastfeeding support group at [employer] or [employer]. ______________________________________________________________________________________________________________________________________________________________________________________________________________________________________________________________________________________________________________________________________________________________________________________________________  Verner Chol AND ERRANDS . Who can help with a thorough cleaning before baby is born? . Make a list of people who will help with housekeeping and chores, like laundry, light cleaning, dishes, bathrooms, etc. . Who can run some errands for you? Marland Kitchen What can you do to make sure you are stocked with basic supplies  before baby is born? . Who is going to do the  shopping? ______________________________________________________________________________________________________________________________________________________________________________________________________________________________________________________________________________________________________________________________________________________________________________________________________     Family Adjustment *Nurture yourselves.it helps parents be more loving and allows for better bonding with their child.* . What sorts of things do you and partner enjoy doing together? Which activities help you to connect and strengthen your relationship? Make a list of those things. Make a list of people whom you trust to care for your baby so you can have some time together as a couple. . What types of things help partner feel connected to Mom? Make a list. . What needs will partner have in order to bond with baby? . Other children? Who will care for them when you go into labor and while you are in the hospital? . Think about what the needs of your older children might be. Who can help you meet those needs? In what ways are you helping them prepare for bringing baby home? List some specific strategies you have for family adjustment. _______________________________________________________________________________________________________________________________________________________________________________________________________________________________________________________________________________________________________________________________________________  SUPPORT *Someone who can empathize with experiences normalizes your problems and makes them more bearable.* . Make a list of other friends, neighbors, and/or co-workers you know with infants (and small children, if applicable) with whom you can connect. . Make a list of local or online support groups, mom groups, etc. in which you can be  involved. ______________________________________________________________________________________________________________________________________________________________________________________________________________________________________________________________________________________________________________________________________________________________________________________________________  Childcare Plans . Investigate and plan for childcare if mom is returning to work. . Talk about mom's concerns about her transition back to work. . Talk about partner's concerns regarding this transition.  Mental Health *Your mental health is one of the highest priorities for a pregnant or postpartum mom.* . 1 in 5 women experience anxiety and/or depression from the time of conception through the first year after birth. . Postpartum Mood Disorders are the #1 complication of pregnancy and childbirth and the suffering experienced by these mothers is not necessary! These illnesses are temporary and respond well to treatment, which often includes self-care, social support, talk therapy, and medication when needed. . Women experiencing anxiety and depression often say things like: "I'm supposed to be happy.why do I feel so sad?", "Why can't I snap out of it?", "I'm having thoughts that scare me." . There is no need to be embarrassed if you are feeling these symptoms: o Overwhelmed, anxious, angry, sad, guilty, irritable, hopeless, exhausted but can't sleep o You are NOT alone. You are NOT to blame. With help, you WILL be well. . Where can I find help? Medical professionals such as your OB, midwife, gynecologist, family practitioner, primary care provider, pediatrician, or mental health providers; [facility] support groups: Feelings After Birth, Breastfeeding Support Group, Baby and Me Group, and Fit 4 Two exercise classes. . You have permission to ask for help. It will confirm your feelings, validate your  experiences, share/learn coping strategies, and gain support and encouragement as you heal. You are important! BRAINSTORM . Make a list of local resources, including resources for mom and for partner. . Identify support groups. . Identify people to call late at night - include names and contact info. . Talk with partner about perinatal mood and anxiety disorders. . Talk with your OB, midwife, and doula about baby blues and about perinatal mood and anxiety disorders. . Talk with your pediatrician about perinatal mood and anxiety disorders.   Support & San[employer]vers   What do you really need?  . Basics . In preparing for a new baby, many expectant parents spend hours shopping  for baby clothes, decorating the nursery, and deciding which car seat to buy. Yet most don't think much about what the reality of parenting a newborn will be like, and what they need to make it through that. So, here is the advice of experienced parents. We know you'll read this, and think "they're exaggerating, I don't really need that." Just trust [place] on these, OK? Plan for all of this, and if it turns out you don't need it, come back and teach [place] how you did it!  Satira Anis (Once baby's survival needs are met, make sure you attend to your own survival needs!) . Sleep . An average newborn sleeps 16-18 hours per day, over 6-7 sleep periods, rarely more than three hours at a time. It is normal and healthy for a newborn to wake throughout the night... but really hard on parents!! . Naps. Prioritize sleep above any responsibilities like: cleaning house, visiting friends, running errands, etc.  Sleep whenever baby sleeps. If you can't nap, at least have restful times when baby eats. The more rest you get, the more patient you will be, the more emotionally stable, and better at solving problems.  . Food . You may not have realized it would be difficult to eat when you have a newborn. Yet, when we talk to . countless new  parents, they say things like "it may be 2:00 pm when I realize I haven't had breakfast yet." Or "every time we sit down to dinner, baby needs to eat, and my food gets cold, so I don't bother to eat it." . Finger food. Before your baby is born, stock up with one months' worth of food that: 1) you can eat with one hand while holding a baby, 2) doesn't need to be prepped, 3) is good hot or cold, 4) doesn't spoil when left out for a few hours, and 5) you like to eat. Think about: nuts, dried fruit, Clif bars, pretzels, jerky, gogurt, baby carrots, apples, bananas, crackers, cheez-n-crackers, string cheese, hot pockets or frozen burritos to microwave, garden burgers and breakfast pastries to put in the toaster, yogurt drinks, etc. . Restaurant Menus. Make lists of your favorite restaurants & menu items. When family/friends want to help, you can give specific information without much thought. They can either bring you the food or send gift cards for just the right meals. Rosaura Carpenter Meals.  Take some time to make a few meals to put in the freezer ahead of time.  Easy to freeze meals can be anything such as soup, lasagna, chicken pie, or spaghetti sauce. . Set up a Meal Schedule.  Ask friends and family to sign up to bring you meals during the first few weeks of being home. (It can be passed around at baby showers!) You have no idea how helpful this will be until you are in the throes of parenting.  https://hamilton-woodard.com/ is a great website to check out. . Emotional Support . Know who to call when you're stressed out. Parenting a newborn is very challenging work. There are times when it totally overwhelms your normal coping abilities. EVERY NEW PARENT NEEDS TO HAVE A PLAN FOR WHO TO CALL WHEN THEY JUST CAN'T COPE ANY MORE. (And it has to be someone other than the baby's other parent!) Before your baby is born, come up with at least one person you can call for support - write their phone number down and post it on the  refrigerator. Marland Kitchen Anxiety & Sadness. Baby blues  are normal after pregnancy; however, there are more severe types of anxiety & sadness which can occur and should not be ignored.  They are always treatable, but you have to take the first step by reaching out for help. [facility] offers a "Mom Talk" group which meets every Tuesday from 10 am - 11 am.  This group is for new moms who need support and connection after their babies are born.  Call [phone].  Marland Kitchen Really, Really Helpful (Plan for them! Make sure these happen often!!) . Physical Support with Taking Care of Yourselves . Asking friends and family. Before your baby is born, set up a schedule of people who can come and visit and help out (or ask a friend to schedule for you). Any time someone says "let me know what I can do to help," sign them up for a day. When they get there, their job is not to take care of the baby (that's your job and your joy). Their job is to take care of you!  . Postpartum doulas. If you don't have anyone you can call on for support, look into postpartum doulas:  professionals at helping parents with caring for baby, caring for themselves, getting breastfeeding started, and helping with household tasks. www.padanc.org is a helpful website for learning about doulas in our area. . Peer Support / Parent Groups . Why: One of the greatest ideas for new parents is to be around other new parents. Parent groups give you a chance to share and listen to others who are going through the same season of life, get a sense of what is normal infant development by watching several babies learn and grow, share your stories of triumph and struggles with empathetic ears, and forgive your own mistakes when you realize all parents are learning by trial and error. . Where to find: There are many places you can meet other new parents throughout our community.  [facility] offers the following classes for new moms and their little ones:  Baby  and Me (Birth to [facility]) and Breastfeeding Support Group. Go to www.conehealthybaby.com or call [phone] for more information. . Time for your Relationship . It's easy to get so caught up in meeting baby's immediate needs that it's hard to find time to connect with your partner, and meet the needs of your relationship. It's also easy to forget what "quality time with your partner" actually looks like. If you take your baby on a date, you'd be amazed how much of your couple time is spent feeding the baby, diapering the baby, admiring the baby, and talking about the baby. . Dating: Try to take time for just the two of you. Babysitter tip: Sometimes when moms are breastfeeding a newborn, they find it hard to figure out how to schedule outings around baby's unpredictable feeding schedules. Have the babysitter come for a three hour period. When she comes over, if baby has just eaten, you can leave right away, and come back in two hours. If baby hasn't fed recently, you start the date at home. Once baby gets hungry and gets a good feeding in, you can head out for the rest of your date time. . Date Nights at Home: If you can't get out, at least set aside one evening a week to prioritize your relationship: whenever baby dozes off or doesn't have any immediate needs, spend a little time focusing on each other. . Potential conflicts: The main relationship conflicts that come up for new parents  are: issues related to sexuality, financial stresses, a feeling of an unfair division of household tasks, and conflicts in parenting styles. The more you can work on these issues before baby arrives, the better!  Clint Guy and Frills (Don't forget these. and don't feel guilty for indulging in them!) . Everyone has something in life that is a fun little treat that they do just for themselves. It may be: reading the morning paper, or going for a daily jog, or having coffee with a friend once a week, or going to a movie on Friday  nights, or fine chocolates, or bubble baths, or curling up with a good book. . Unless you do fun things for yourself every now and then, it's hard to have the energy for fun with your baby. Whatever your "special" treats are, make sure you find a way to continue to indulge in them after your baby is born. These special moments can recharge you, and allow you to return to baby with a new joy   PERINATAL MOOD DISORDERS: [place]   Emergency and Crisis Resources:  If you are an imminent risk to self or others, are experiencing intense personal distress, and/or have noticed significant changes in activities of daily living, call:  . 911 . [facility]: 4304190616 . Mobile Crisis: 581 699 9488 . National Suicide Hotline: 941-327-2608 Or visit the following crisis centers: . Local Emergency Departments . [facility]: [address] [place]. Hours: 8:30AM-5PM. Insurance Accepted: Medicaid, Medicare, and Uninsured.  Marland Kitchen [facility]  [address], [place] Mon-Friday 8am-3pm  [phone]                                                                                    Non-Crisis Resources: To identify specific providers that are covered by your insurance, contact your insurance company or local agencies: [place]: 4245618663 [facility] and [place]: [phone] [place]: [phone] Postpartum Support International- [place] 1-2141542292                                                      Outpatient therapy and medication management providers:  [facility] [phone] Hours: 9AM-5PM  Insurance Accepted: Alben Spittle, Lorella Nimrod, Freddrick March, [place], Medicare  [facility] ([place]) [phone] Hours: 8AM-5PM  nsurance Accepted: All insurances EXCEPT AARP, [place], [place], and  [place]: [phone]             Hours: 8AM-8PM Insurance Accepted: Cristal Ford, Freddrick March, [place], Medicare, [phone] [facility]: [phone] Hours: 8:30AM-7PM Insurance Accepted: Cristal Ford, Medicaid, Medicare, Tricare, [employer] Counseling:  [place] Accepted:  Holland Falling, Lorella Nimrod, [employer], [employer], [employer] [phone] Hours: 9AM-5:30PM Insurance Accepted: Alben Spittle, [place], [place], and Medicaid, [employer], [employer]  Counseling:  [phone] Hours: 9am-5pm Insurance Accepted: [place]; they do not accept Medicaid/Medicare [place]: [phone] Hours: 9am-9pm Insurance Accepted: All major insurance including Medicaid and Medicare [facility]: [phone] Hours: 9AM-5:30PM Insurance Accepted: All insurances EXCEPT Medicaid and Medicare. [facility]: [place]: [phone] [place]:  [place] (support for children in the NICU and/or with special needs), [employer]: [phone]                                                                                     Online Resources: Postpartum Support International: http://jones-berg.com/  800-944-4PPD 2Moms Supporting Moms:  www.momssupportingmoms.net

## 2019-12-16 ENCOUNTER — Other Ambulatory Visit: Payer: Self-pay

## 2019-12-16 ENCOUNTER — Other Ambulatory Visit: Payer: Medicaid Other

## 2019-12-16 ENCOUNTER — Encounter: Payer: Self-pay | Admitting: Obstetrics and Gynecology

## 2019-12-16 ENCOUNTER — Ambulatory Visit (INDEPENDENT_AMBULATORY_CARE_PROVIDER_SITE_OTHER): Payer: Medicaid Other | Admitting: Obstetrics and Gynecology

## 2019-12-16 ENCOUNTER — Other Ambulatory Visit: Payer: Self-pay | Admitting: Obstetrics & Gynecology

## 2019-12-16 VITALS — BP 118/78 | HR 92 | Wt 179.9 lb

## 2019-12-16 DIAGNOSIS — O0990 Supervision of high risk pregnancy, unspecified, unspecified trimester: Secondary | ICD-10-CM

## 2019-12-16 DIAGNOSIS — O30043 Twin pregnancy, dichorionic/diamniotic, third trimester: Secondary | ICD-10-CM

## 2019-12-16 DIAGNOSIS — O99891 Other specified diseases and conditions complicating pregnancy: Secondary | ICD-10-CM

## 2019-12-16 DIAGNOSIS — O99012 Anemia complicating pregnancy, second trimester: Secondary | ICD-10-CM

## 2019-12-16 DIAGNOSIS — O09523 Supervision of elderly multigravida, third trimester: Secondary | ICD-10-CM

## 2019-12-16 DIAGNOSIS — D509 Iron deficiency anemia, unspecified: Secondary | ICD-10-CM

## 2019-12-16 DIAGNOSIS — Z23 Encounter for immunization: Secondary | ICD-10-CM | POA: Diagnosis not present

## 2019-12-16 MED ORDER — PANTOPRAZOLE SODIUM 40 MG PO TBEC: 40.0000 mg | DELAYED_RELEASE_TABLET | Freq: Every day | ORAL | 3 refills | Status: AC

## 2019-12-16 NOTE — Addendum Note (Signed)
Addended by: Faythe Casa on: 12/16/2019 09:35 AM   Modules accepted: Orders

## 2019-12-16 NOTE — Patient Instructions (Signed)

## 2019-12-16 NOTE — Progress Notes (Signed)
   PRENATAL VISIT NOTE  Subjective:  Bonnie Mccoy is a 34 y.o. [phone] at [redacted]w[redacted]d being seen today for ongoing prenatal care.  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  Patient reports heartburn.  Contractions: Irritability. Vag. Bleeding: None.  Movement: Present. Denies leaking of fluid.   The following portions of the patient's history were reviewed and updated as appropriate: allergies, current medications, past family history, past medical history, past social history, past surgical history and problem list.   Objective:   Vitals:   12/16/19 0903  BP: 118/78  Pulse: 92  Weight: 179 lb 14.4 oz (81.6 kg)    Fetal Status: Fetal Heart Rate (bpm): 140/155   Movement: Present     General:  Alert, oriented and cooperative. Patient is in no acute distress.  Skin: Skin is warm and dry. No rash noted.   Cardiovascular: Normal heart rate noted  Respiratory: Normal respiratory effort, no problems with respiration noted  Abdomen: Soft, gravid, appropriate for gestational age.  Pain/Pressure: Present     Pelvic: Cervical exam deferred        Extremities: Normal range of motion.  Edema: Trace  Mental Status: Normal mood and affect. Normal behavior. Normal judgment and thought content.   Assessment and Plan:  Pregnancy: [id number] at [redacted]w[redacted]d 1. Supervision of high risk pregnancy, antepartum Patient is doing well without complaints Third trimester labs today Patient  interested in BTL- papers signed today Rx protonix today  2. Dichorionic diamniotic twin pregnancy in third trimester Normal growth on 11/18 Follow up ultrasound scheduled  3. Multigravida of advanced maternal age in third trimester   4. Maternal iron deficiency anemia complicating pregnancy in second trimester Continue iron   Preterm labor symptoms and general obstetric precautions including but not limited to vaginal bleeding, contractions, leaking of fluid and fetal movement were reviewed in detail with the patient. Please refer to After Visit Summary for other counseling recommendations.   Return in about 2 weeks (around 12/30/2019) for in person, ROB, High risk.  Future Appointments  Date Time Provider Department Center  12/16/2019  9:35 AM Wynee Matarazzo, Gigi Gin, MD [facility]  12/26/2019 10:15 AM [facility] NURSE [facility]  12/26/2019 10:30 AM [facility] US3 [facility]  01/05/2020  8:15 AM [facility]  01/09/2020  9:45 AM [facility] NURSE [facility]  01/09/2020 10:00 AM [facility] US1 [facility]  01/12/2020  4:15 PM Desenglau, Shireen Quan, PT [facility]    Catalina Antigua, MD

## 2019-12-16 NOTE — Progress Notes (Signed)
BTL paperwork signed tdap given  28 wk packet provided  [place], RN  12/16/19

## 2019-12-17 ENCOUNTER — Encounter: Payer: Self-pay | Admitting: *Deleted

## 2019-12-17 LAB — CBC
Hematocrit: 31.5 % — ABNORMAL LOW (ref 34.0–46.6)
Hemoglobin: 10.6 g/dL — ABNORMAL LOW (ref 11.1–15.9)
MCH: 31.9 pg (ref 26.6–33.0)
MCHC: 33.7 g/dL (ref 31.5–35.7)
MCV: 95 fL (ref 79–97)
Platelets: 384 10*3/uL (ref 150–450)
RBC: 3.32 x10E6/uL — ABNORMAL LOW (ref 3.77–5.28)
RDW: 12.1 % (ref 11.7–15.4)
WBC: 14.7 10*3/uL — ABNORMAL HIGH (ref 3.4–10.8)

## 2019-12-17 LAB — HIV ANTIBODY (ROUTINE TESTING W REFLEX): HIV Screen 4th Generation wRfx: NONREACTIVE

## 2019-12-17 LAB — GLUCOSE TOLERANCE, 2 HOURS W/ 1HR
Glucose, 1 hour: 134 mg/dL (ref 65–179)
Glucose, 2 hour: 107 mg/dL (ref 65–152)
Glucose, Fasting: 76 mg/dL (ref 65–91)

## 2019-12-17 LAB — RPR: RPR Ser Ql: NONREACTIVE

## 2019-12-22 NOTE — BH Specialist Note (Deleted)
Integrated Behavioral Health via Telemedicine Visit  12/22/2019 Bonnie Mccoy [id number]  Number of Integrated Behavioral Health visits: *** Session Start time: 8:15***  Session End time: 8:45*** Total time: {IBH Total Time:21014050}  Referring Provider: *** Patient/Family location: Home*** [facility] Provider location: [facility]  All persons participating in visit: Patient *** and [facility] Bonnie Mccoy ***  Types of Service: {CHL AMB TYPE OF SERVICE:[phone]}  I connected with Bonnie Mccoy and/or Bonnie Mccoy's {family members:20773} by {CHL AMB IBH TELEMEDICINE MODES:[phone]} and verified that I am speaking with the correct person using two identifiers.    Discussed confidentiality: {YES/NO:21197}  I discussed the limitations of telemedicine and the availability of in person appointments.  Discussed there is a possibility of technology failure and discussed alternative modes of communication if that failure occurs.  I discussed that engaging in this telemedicine visit, they consent to the provision of behavioral healthcare and the services will be billed under their insurance.  Patient and/or legal guardian expressed understanding and consented to Telemedicine visit: {YES/NO:21197}  Presenting Concerns: Patient and/or family reports the following symptoms/concerns: *** Duration of problem: ***; Severity of problem: {Mild/Moderate/Severe:20260}  Patient and/or Family's Strengths/Protective Factors: {CHL AMB BH PROTECTIVE [phone]}  Goals Addressed: Patient will: 1.  Reduce symptoms of: {IBH Symptoms:21014056}  2.  Increase knowledge and/or ability of: {IBH Patient Tools:21014057}  3.  Demonstrate ability to: {IBH Goals:21014053}  Progress towards Goals: {CHL AMB BH PROGRESS TOWARDS GOALS:[phone]}  Interventions: Interventions utilized:  {IBH Interventions:21014054} Standardized Assessments completed: {IBH  Screening Tools:21014051}  Patient and/or Family Response: ***  Assessment: Patient currently experiencing ***.   Patient may benefit from ***.  Plan: 1. Follow up with behavioral health clinician on : *** 2. Behavioral recommendations: *** 3. Referral(s): {IBH Referrals:21014055}  I discussed the assessment and treatment plan with the patient and/or parent/guardian. They were provided an opportunity to ask questions and all were answered. They agreed with the plan and demonstrated an understanding of the instructions.   They were advised to call back or seek an in-person evaluation if the symptoms worsen or if the condition fails to improve as anticipated.  Valetta Close Jaylynn Siefert, LCSW

## 2019-12-26 ENCOUNTER — Encounter: Payer: Self-pay | Admitting: *Deleted

## 2019-12-26 ENCOUNTER — Ambulatory Visit: Payer: Medicaid Other | Attending: Obstetrics and Gynecology

## 2019-12-26 ENCOUNTER — Other Ambulatory Visit: Payer: Self-pay | Admitting: Obstetrics and Gynecology

## 2019-12-26 ENCOUNTER — Other Ambulatory Visit: Payer: Self-pay | Admitting: *Deleted

## 2019-12-26 ENCOUNTER — Other Ambulatory Visit: Payer: Self-pay

## 2019-12-26 ENCOUNTER — Ambulatory Visit: Payer: Medicaid Other | Admitting: *Deleted

## 2019-12-26 DIAGNOSIS — O30033 Twin pregnancy, monochorionic/diamniotic, third trimester: Secondary | ICD-10-CM | POA: Diagnosis not present

## 2019-12-26 DIAGNOSIS — O0990 Supervision of high risk pregnancy, unspecified, unspecified trimester: Secondary | ICD-10-CM

## 2019-12-26 DIAGNOSIS — O09899 Supervision of other high risk pregnancies, unspecified trimester: Secondary | ICD-10-CM | POA: Diagnosis present

## 2019-12-26 DIAGNOSIS — Z148 Genetic carrier of other disease: Secondary | ICD-10-CM

## 2019-12-26 DIAGNOSIS — O30049 Twin pregnancy, dichorionic/diamniotic, unspecified trimester: Secondary | ICD-10-CM

## 2019-12-26 DIAGNOSIS — F17200 Nicotine dependence, unspecified, uncomplicated: Secondary | ICD-10-CM

## 2019-12-26 DIAGNOSIS — Z362 Encounter for other antenatal screening follow-up: Secondary | ICD-10-CM

## 2019-12-26 DIAGNOSIS — O09523 Supervision of elderly multigravida, third trimester: Secondary | ICD-10-CM | POA: Diagnosis not present

## 2019-12-26 DIAGNOSIS — O99013 Anemia complicating pregnancy, third trimester: Secondary | ICD-10-CM

## 2019-12-26 DIAGNOSIS — D649 Anemia, unspecified: Secondary | ICD-10-CM

## 2019-12-26 DIAGNOSIS — Z3A29 29 weeks gestation of pregnancy: Secondary | ICD-10-CM

## 2019-12-26 DIAGNOSIS — O0933 Supervision of pregnancy with insufficient antenatal care, third trimester: Secondary | ICD-10-CM

## 2019-12-26 DIAGNOSIS — O99333 Smoking (tobacco) complicating pregnancy, third trimester: Secondary | ICD-10-CM

## 2019-12-31 ENCOUNTER — Encounter: Payer: Medicaid Other | Admitting: Women's Health

## 2020-01-04 ENCOUNTER — Encounter (HOSPITAL_COMMUNITY): Payer: Self-pay | Admitting: Obstetrics and Gynecology

## 2020-01-04 ENCOUNTER — Inpatient Hospital Stay (HOSPITAL_COMMUNITY): Payer: Medicaid Other | Admitting: Anesthesiology

## 2020-01-04 ENCOUNTER — Other Ambulatory Visit: Payer: Self-pay

## 2020-01-04 ENCOUNTER — Inpatient Hospital Stay (HOSPITAL_COMMUNITY)
Admission: EM | Admit: 2020-01-04 | Discharge: 2020-01-07 | DRG: 786 | Disposition: A | Discharge status: Home / Self Care | Payer: Medicaid Other | Attending: Obstetrics and Gynecology | Admitting: Obstetrics and Gynecology

## 2020-01-04 ENCOUNTER — Encounter (HOSPITAL_COMMUNITY)
Admission: EM | Disposition: A | Discharge status: Home / Self Care | Payer: Self-pay | Source: Home / Self Care | Attending: Obstetrics and Gynecology

## 2020-01-04 DIAGNOSIS — O4593 Premature separation of placenta, unspecified, third trimester: Principal | ICD-10-CM | POA: Diagnosis present

## 2020-01-04 DIAGNOSIS — O99324 Drug use complicating childbirth: Secondary | ICD-10-CM | POA: Diagnosis present

## 2020-01-04 DIAGNOSIS — O0933 Supervision of pregnancy with insufficient antenatal care, third trimester: Secondary | ICD-10-CM | POA: Diagnosis not present

## 2020-01-04 DIAGNOSIS — D509 Iron deficiency anemia, unspecified: Secondary | ICD-10-CM

## 2020-01-04 DIAGNOSIS — O30043 Twin pregnancy, dichorionic/diamniotic, third trimester: Secondary | ICD-10-CM | POA: Diagnosis present

## 2020-01-04 DIAGNOSIS — Z98891 History of uterine scar from previous surgery: Secondary | ICD-10-CM | POA: Diagnosis not present

## 2020-01-04 DIAGNOSIS — O99344 Other mental disorders complicating childbirth: Secondary | ICD-10-CM | POA: Diagnosis present

## 2020-01-04 DIAGNOSIS — F1490 Cocaine use, unspecified, uncomplicated: Secondary | ICD-10-CM | POA: Diagnosis present

## 2020-01-04 DIAGNOSIS — O99334 Smoking (tobacco) complicating childbirth: Secondary | ICD-10-CM | POA: Diagnosis present

## 2020-01-04 DIAGNOSIS — Z3A30 30 weeks gestation of pregnancy: Secondary | ICD-10-CM

## 2020-01-04 DIAGNOSIS — E669 Obesity, unspecified: Secondary | ICD-10-CM | POA: Diagnosis present

## 2020-01-04 DIAGNOSIS — O368332 Maternal care for abnormalities of the fetal heart rate or rhythm, third trimester, fetus 2: Secondary | ICD-10-CM | POA: Diagnosis not present

## 2020-01-04 DIAGNOSIS — F17210 Nicotine dependence, cigarettes, uncomplicated: Secondary | ICD-10-CM | POA: Diagnosis present

## 2020-01-04 DIAGNOSIS — O09899 Supervision of other high risk pregnancies, unspecified trimester: Secondary | ICD-10-CM

## 2020-01-04 DIAGNOSIS — D62 Acute posthemorrhagic anemia: Secondary | ICD-10-CM | POA: Diagnosis not present

## 2020-01-04 DIAGNOSIS — O99214 Obesity complicating childbirth: Secondary | ICD-10-CM | POA: Diagnosis present

## 2020-01-04 DIAGNOSIS — F410 Panic disorder [episodic paroxysmal anxiety] without agoraphobia: Secondary | ICD-10-CM | POA: Diagnosis present

## 2020-01-04 DIAGNOSIS — Z20822 Contact with and (suspected) exposure to covid-19: Secondary | ICD-10-CM | POA: Diagnosis present

## 2020-01-04 DIAGNOSIS — O9081 Anemia of the puerperium: Secondary | ICD-10-CM | POA: Diagnosis not present

## 2020-01-04 DIAGNOSIS — F1290 Cannabis use, unspecified, uncomplicated: Secondary | ICD-10-CM | POA: Diagnosis present

## 2020-01-04 DIAGNOSIS — O322XX2 Maternal care for transverse and oblique lie, fetus 2: Secondary | ICD-10-CM | POA: Diagnosis present

## 2020-01-04 DIAGNOSIS — O09523 Supervision of elderly multigravida, third trimester: Secondary | ICD-10-CM | POA: Diagnosis not present

## 2020-01-04 DIAGNOSIS — O0990 Supervision of high risk pregnancy, unspecified, unspecified trimester: Secondary | ICD-10-CM

## 2020-01-04 DIAGNOSIS — O99325 Drug use complicating the puerperium: Secondary | ICD-10-CM | POA: Diagnosis not present

## 2020-01-04 LAB — RAPID URINE DRUG SCREEN, HOSP PERFORMED
Amphetamines: NOT DETECTED
Barbiturates: NOT DETECTED
Benzodiazepines: NOT DETECTED
Cocaine: NOT DETECTED
Opiates: NOT DETECTED
Tetrahydrocannabinol: POSITIVE — AB

## 2020-01-04 LAB — CBC
HCT: 32.5 % — ABNORMAL LOW (ref 36.0–46.0)
Hemoglobin: 10.9 g/dL — ABNORMAL LOW (ref 12.0–15.0)
MCH: 31.9 pg (ref 26.0–34.0)
MCHC: 33.5 g/dL (ref 30.0–36.0)
MCV: 95 fL (ref 80.0–100.0)
Platelets: 389 10*3/uL (ref 150–400)
RBC: 3.42 MIL/uL — ABNORMAL LOW (ref 3.87–5.11)
RDW: 12.7 % (ref 11.5–15.5)
WBC: 17 10*3/uL — ABNORMAL HIGH (ref 4.0–10.5)
nRBC: 0 % (ref 0.0–0.2)

## 2020-01-04 LAB — RESP PANEL BY RT-PCR (FLU A&B, COVID) ARPGX2
Influenza A by PCR: NEGATIVE
Influenza B by PCR: NEGATIVE
SARS Coronavirus 2 by RT PCR: NEGATIVE

## 2020-01-04 LAB — WET PREP, GENITAL
Clue Cells Wet Prep HPF POC: NONE SEEN
Sperm: NONE SEEN
Trich, Wet Prep: NONE SEEN
Yeast Wet Prep HPF POC: NONE SEEN

## 2020-01-04 LAB — TYPE AND SCREEN
ABO/RH(D): O POS
Antibody Screen: NEGATIVE

## 2020-01-04 LAB — RPR: RPR Ser Ql: NONREACTIVE

## 2020-01-04 SURGERY — Surgical Case
Anesthesia: General | Site: Abdomen

## 2020-01-04 MED ORDER — COCONUT OIL OIL: 1.0000 "application " | TOPICAL_OIL | Status: DC | PRN

## 2020-01-04 MED ORDER — OXYTOCIN-SODIUM CHLORIDE 30-0.9 UT/500ML-% IV SOLN
2.5000 [IU]/h | INTRAVENOUS | Status: AC
  Administered 2020-01-04: 2.5 [IU]/h via INTRAVENOUS

## 2020-01-04 MED ORDER — MAGNESIUM SULFATE BOLUS VIA INFUSION: 4.0000 g | Freq: Once | INTRAVENOUS | Status: DC

## 2020-01-04 MED ORDER — PROPOFOL 10 MG/ML IV BOLUS
INTRAVENOUS | Status: DC | PRN
  Administered 2020-01-04: 200 mg via INTRAVENOUS
  Administered 2020-01-04: 50 mg via INTRAVENOUS

## 2020-01-04 MED ORDER — SOD CITRATE-CITRIC ACID 500-334 MG/5ML PO SOLN: 30.0000 mL | ORAL | Status: DC | PRN

## 2020-01-04 MED ORDER — DEXAMETHASONE SODIUM PHOSPHATE 10 MG/ML IJ SOLN
INTRAMUSCULAR | Status: AC
  Filled 2020-01-04: qty 1

## 2020-01-04 MED ORDER — MAGNESIUM SULFATE 40 GM/1000ML IV SOLN
INTRAVENOUS | Status: AC
  Filled 2020-01-04: qty 1000

## 2020-01-04 MED ORDER — MENTHOL 3 MG MT LOZG: 1.0000 | LOZENGE | OROMUCOSAL | Status: DC | PRN

## 2020-01-04 MED ORDER — MIDAZOLAM HCL 2 MG/2ML IJ SOLN
INTRAMUSCULAR | Status: DC | PRN
  Administered 2020-01-04: 2 mg via INTRAVENOUS

## 2020-01-04 MED ORDER — HYDROMORPHONE 1 MG/ML IV SOLN: INTRAVENOUS | Status: DC

## 2020-01-04 MED ORDER — PROPOFOL 10 MG/ML IV BOLUS
INTRAVENOUS | Status: AC
  Filled 2020-01-04: qty 40

## 2020-01-04 MED ORDER — SODIUM CHLORIDE 0.9 % IV SOLN: 510.0000 mg | INTRAVENOUS | Status: DC

## 2020-01-04 MED ORDER — MAGNESIUM SULFATE BOLUS VIA INFUSION
6.0000 g | Freq: Once | INTRAVENOUS | Status: AC
  Administered 2020-01-04: 6 g via INTRAVENOUS
  Filled 2020-01-04: qty 1000

## 2020-01-04 MED ORDER — PROMETHAZINE HCL 25 MG/ML IJ SOLN: 6.2500 mg | INTRAMUSCULAR | Status: DC | PRN

## 2020-01-04 MED ORDER — DIPHENHYDRAMINE HCL 25 MG PO CAPS
25.0000 mg | ORAL_CAPSULE | Freq: Four times a day (QID) | ORAL | Status: DC | PRN
Start: 2020-01-04 — End: 2020-01-07

## 2020-01-04 MED ORDER — SUCCINYLCHOLINE CHLORIDE 20 MG/ML IJ SOLN
INTRAMUSCULAR | Status: DC | PRN
  Administered 2020-01-04: 120 mg via INTRAVENOUS

## 2020-01-04 MED ORDER — OXYTOCIN-SODIUM CHLORIDE 30-0.9 UT/500ML-% IV SOLN
2.5000 [IU]/h | INTRAVENOUS | Status: DC
  Filled 2020-01-04: qty 500

## 2020-01-04 MED ORDER — LACTATED RINGERS IV SOLN: INTRAVENOUS | Status: DC

## 2020-01-04 MED ORDER — FENTANYL CITRATE (PF) 100 MCG/2ML IJ SOLN
INTRAMUSCULAR | Status: DC | PRN
  Administered 2020-01-04 (×3): 100 ug via INTRAVENOUS
  Administered 2020-01-04 (×2): 50 ug via INTRAVENOUS
  Administered 2020-01-04: 100 ug via INTRAVENOUS

## 2020-01-04 MED ORDER — LIDOCAINE HCL (PF) 1 % IJ SOLN: 30.0000 mL | INTRAMUSCULAR | Status: DC | PRN

## 2020-01-04 MED ORDER — FENTANYL CITRATE (PF) 100 MCG/2ML IJ SOLN
25.0000 ug | INTRAMUSCULAR | Status: DC | PRN
  Administered 2020-01-04: 25 ug via INTRAVENOUS
  Administered 2020-01-04: 50 ug via INTRAVENOUS

## 2020-01-04 MED ORDER — WITCH HAZEL-GLYCERIN EX PADS: 1.0000 "application " | MEDICATED_PAD | CUTANEOUS | Status: DC | PRN

## 2020-01-04 MED ORDER — OXYCODONE-ACETAMINOPHEN 5-325 MG PO TABS: 2.0000 | ORAL_TABLET | ORAL | Status: DC | PRN

## 2020-01-04 MED ORDER — ONDANSETRON HCL 4 MG/2ML IJ SOLN
INTRAMUSCULAR | Status: DC | PRN
  Administered 2020-01-04: 4 mg via INTRAVENOUS

## 2020-01-04 MED ORDER — FENTANYL CITRATE (PF) 100 MCG/2ML IJ SOLN
INTRAMUSCULAR | Status: AC
  Filled 2020-01-04: qty 2

## 2020-01-04 MED ORDER — OXYTOCIN-SODIUM CHLORIDE 30-0.9 UT/500ML-% IV SOLN
INTRAVENOUS | Status: AC
  Filled 2020-01-04: qty 500

## 2020-01-04 MED ORDER — MIDAZOLAM HCL 2 MG/2ML IJ SOLN
INTRAMUSCULAR | Status: AC
  Filled 2020-01-04: qty 2

## 2020-01-04 MED ORDER — OXYTOCIN-SODIUM CHLORIDE 30-0.9 UT/500ML-% IV SOLN
INTRAVENOUS | Status: DC | PRN
  Administered 2020-01-04: 200 mL via INTRAVENOUS
  Administered 2020-01-04: 300 mL via INTRAVENOUS
  Administered 2020-01-04: 100 mL via INTRAVENOUS

## 2020-01-04 MED ORDER — ONDANSETRON HCL 4 MG/2ML IJ SOLN: 4.0000 mg | Freq: Four times a day (QID) | INTRAMUSCULAR | Status: DC | PRN

## 2020-01-04 MED ORDER — OXYCODONE-ACETAMINOPHEN 5-325 MG PO TABS: 1.0000 | ORAL_TABLET | ORAL | Status: DC | PRN

## 2020-01-04 MED ORDER — IBUPROFEN 800 MG PO TABS
800.0000 mg | ORAL_TABLET | Freq: Three times a day (TID) | ORAL | Status: DC
  Administered 2020-01-04 – 2020-01-07 (×8): 800 mg via ORAL
  Filled 2020-01-04 (×9): qty 1

## 2020-01-04 MED ORDER — FENTANYL CITRATE (PF) 100 MCG/2ML IJ SOLN
100.0000 ug | INTRAMUSCULAR | Status: DC | PRN
Start: 2020-01-04 — End: 2020-01-04
  Administered 2020-01-04 (×2): 100 ug via INTRAVENOUS
  Filled 2020-01-04 (×2): qty 2

## 2020-01-04 MED ORDER — SODIUM CHLORIDE 0.9 % IV SOLN
2.0000 g | Freq: Four times a day (QID) | INTRAVENOUS | Status: DC
  Administered 2020-01-04 (×2): 2 g via INTRAVENOUS
  Filled 2020-01-04 (×2): qty 2000

## 2020-01-04 MED ORDER — OXYCODONE HCL 5 MG PO TABS
5.0000 mg | ORAL_TABLET | Freq: Four times a day (QID) | ORAL | Status: DC | PRN
  Administered 2020-01-04: 5 mg via ORAL
  Administered 2020-01-05 – 2020-01-06 (×2): 10 mg via ORAL
  Administered 2020-01-07: 5 mg via ORAL
  Filled 2020-01-04: qty 2
  Filled 2020-01-04 (×2): qty 1
  Filled 2020-01-04: qty 2
  Filled 2020-01-04: qty 1

## 2020-01-04 MED ORDER — SIMETHICONE 80 MG PO CHEW
80.0000 mg | CHEWABLE_TABLET | Freq: Three times a day (TID) | ORAL | Status: DC
  Administered 2020-01-05 – 2020-01-07 (×7): 80 mg via ORAL
  Filled 2020-01-04 (×7): qty 1

## 2020-01-04 MED ORDER — LACTATED RINGERS IV SOLN: 500.0000 mL | INTRAVENOUS | Status: DC | PRN

## 2020-01-04 MED ORDER — SODIUM CHLORIDE 0.9 % IV SOLN
INTRAVENOUS | Status: DC | PRN
  Administered 2020-01-04: 500 mg via INTRAVENOUS

## 2020-01-04 MED ORDER — FENTANYL CITRATE (PF) 250 MCG/5ML IJ SOLN
INTRAMUSCULAR | Status: AC
  Filled 2020-01-04: qty 5

## 2020-01-04 MED ORDER — FLEET ENEMA 7-19 GM/118ML RE ENEM: 1.0000 | ENEMA | RECTAL | Status: DC | PRN

## 2020-01-04 MED ORDER — CEFAZOLIN SODIUM-DEXTROSE 2-4 GM/100ML-% IV SOLN
INTRAVENOUS | Status: AC
  Filled 2020-01-04: qty 100

## 2020-01-04 MED ORDER — OXYTOCIN BOLUS FROM INFUSION: 333.0000 mL | Freq: Once | INTRAVENOUS | Status: DC

## 2020-01-04 MED ORDER — ACETAMINOPHEN 325 MG PO TABS
650.0000 mg | ORAL_TABLET | ORAL | Status: DC | PRN
  Administered 2020-01-05: 650 mg via ORAL
  Filled 2020-01-04: qty 2

## 2020-01-04 MED ORDER — MAGNESIUM SULFATE 40 GM/1000ML IV SOLN
2.0000 g/h | INTRAVENOUS | Status: DC
  Administered 2020-01-04: 2 g/h via INTRAVENOUS

## 2020-01-04 MED ORDER — SUCCINYLCHOLINE CHLORIDE 200 MG/10ML IV SOSY
PREFILLED_SYRINGE | INTRAVENOUS | Status: AC
  Filled 2020-01-04: qty 10

## 2020-01-04 MED ORDER — BETAMETHASONE SOD PHOS & ACET 6 (3-3) MG/ML IJ SUSP
12.0000 mg | INTRAMUSCULAR | Status: DC
  Administered 2020-01-04: 12 mg via INTRAMUSCULAR
  Filled 2020-01-04: qty 5

## 2020-01-04 MED ORDER — GABAPENTIN 100 MG PO CAPS: 100.0000 mg | ORAL_CAPSULE | Freq: Two times a day (BID) | ORAL | Status: DC

## 2020-01-04 MED ORDER — ACETAMINOPHEN 325 MG PO TABS: 650.0000 mg | ORAL_TABLET | ORAL | Status: DC | PRN

## 2020-01-04 MED ORDER — ONDANSETRON HCL 4 MG/2ML IJ SOLN
INTRAMUSCULAR | Status: AC
  Filled 2020-01-04: qty 2

## 2020-01-04 MED ORDER — DIPHENHYDRAMINE HCL 50 MG/ML IJ SOLN: 12.5000 mg | Freq: Four times a day (QID) | INTRAMUSCULAR | Status: DC | PRN

## 2020-01-04 MED ORDER — SENNOSIDES-DOCUSATE SODIUM 8.6-50 MG PO TABS: 2.0000 | ORAL_TABLET | Freq: Every evening | ORAL | Status: DC | PRN

## 2020-01-04 MED ORDER — ENOXAPARIN SODIUM 40 MG/0.4ML ~~LOC~~ SOLN
40.0000 mg | SUBCUTANEOUS | Status: DC
  Administered 2020-01-05 – 2020-01-06 (×2): 40 mg via SUBCUTANEOUS
  Filled 2020-01-04 (×2): qty 0.4

## 2020-01-04 MED ORDER — SODIUM CHLORIDE 0.9% FLUSH: 9.0000 mL | INTRAVENOUS | Status: DC | PRN

## 2020-01-04 MED ORDER — LACTATED RINGERS IV SOLN
INTRAVENOUS | Status: DC
Start: 2020-01-04 — End: 2020-01-04

## 2020-01-04 MED ORDER — DIBUCAINE (PERIANAL) 1 % EX OINT
1.0000 | TOPICAL_OINTMENT | CUTANEOUS | Status: DC | PRN
Start: 2020-01-04 — End: 2020-01-07

## 2020-01-04 MED ORDER — CEFAZOLIN SODIUM-DEXTROSE 2-3 GM-%(50ML) IV SOLR
INTRAVENOUS | Status: DC | PRN
  Administered 2020-01-04: 2 g via INTRAVENOUS

## 2020-01-04 MED ORDER — TETANUS-DIPHTH-ACELL PERTUSSIS 5-2.5-18.5 LF-MCG/0.5 IM SUSY: 0.5000 mL | PREFILLED_SYRINGE | Freq: Once | INTRAMUSCULAR | Status: DC

## 2020-01-04 MED ORDER — SIMETHICONE 80 MG PO CHEW
80.0000 mg | CHEWABLE_TABLET | ORAL | Status: DC | PRN
Start: 2020-01-04 — End: 2020-01-07
  Administered 2020-01-04: 80 mg via ORAL
  Filled 2020-01-04: qty 1

## 2020-01-04 MED ORDER — DEXAMETHASONE SODIUM PHOSPHATE 10 MG/ML IJ SOLN
INTRAMUSCULAR | Status: DC | PRN
  Administered 2020-01-04: 10 mg via INTRAVENOUS

## 2020-01-04 MED ORDER — PRENATAL MULTIVITAMIN CH
1.0000 | ORAL_TABLET | Freq: Every day | ORAL | Status: DC
  Administered 2020-01-05 – 2020-01-07 (×3): 1 via ORAL
  Filled 2020-01-04 (×3): qty 1

## 2020-01-04 MED ORDER — NALOXONE HCL 0.4 MG/ML IJ SOLN: 0.4000 mg | INTRAMUSCULAR | Status: DC | PRN

## 2020-01-04 MED ORDER — DIPHENHYDRAMINE HCL 12.5 MG/5ML PO ELIX: 12.5000 mg | ORAL_SOLUTION | Freq: Four times a day (QID) | ORAL | Status: DC | PRN

## 2020-01-04 SURGICAL SUPPLY — 37 items
BENZOIN TINCTURE PRP APPL 2/3 (GAUZE/BANDAGES/DRESSINGS) ×4 IMPLANT
CANISTER SUCT 3000ML PPV (MISCELLANEOUS) ×8 IMPLANT
CHLORAPREP W/TINT 26ML (MISCELLANEOUS) ×4 IMPLANT
CLOSURE WOUND 1/2 X4 (GAUZE/BANDAGES/DRESSINGS) ×1
CLOSURE WOUND 1/4 X3 (GAUZE/BANDAGES/DRESSINGS) ×1
DRSG OPSITE POSTOP 4X10 (GAUZE/BANDAGES/DRESSINGS) ×4 IMPLANT
ELECT REM PT RETURN 9FT ADLT (ELECTROSURGICAL) ×8
ELECTRODE REM PT RTRN 9FT ADLT (ELECTROSURGICAL) ×4 IMPLANT
EXTRACTOR VACUUM KIWI (MISCELLANEOUS) IMPLANT
GLOVE BIOGEL PI IND STRL 7.0 (GLOVE) ×4 IMPLANT
GLOVE BIOGEL PI IND STRL 7.5 (GLOVE) ×2 IMPLANT
GLOVE BIOGEL PI INDICATOR 7.0 (GLOVE) ×4
GLOVE BIOGEL PI INDICATOR 7.5 (GLOVE) ×2
GLOVE SKINSENSE NS SZ7.0 (GLOVE) ×4
GLOVE SKINSENSE STRL SZ7.0 (GLOVE) ×4 IMPLANT
GOWN STRL REUS W/ TWL LRG LVL3 (GOWN DISPOSABLE) ×4 IMPLANT
GOWN STRL REUS W/ TWL XL LVL3 (GOWN DISPOSABLE) ×2 IMPLANT
GOWN STRL REUS W/TWL LRG LVL3 (GOWN DISPOSABLE) ×4
GOWN STRL REUS W/TWL XL LVL3 (GOWN DISPOSABLE) ×2
NS IRRIG 1000ML POUR BTL (IV SOLUTION) ×4 IMPLANT
PACK C SECTION WH (CUSTOM PROCEDURE TRAY) ×8 IMPLANT
PAD ABD 7.5X8 STRL (GAUZE/BANDAGES/DRESSINGS) ×8 IMPLANT
PAD ABD DERMACEA PRESS 5X9 (GAUZE/BANDAGES/DRESSINGS) ×4 IMPLANT
PAD OB MATERNITY 4.3X12.25 (PERSONAL CARE ITEMS) ×8 IMPLANT
PAD PREP 24X48 CUFFED NSTRL (MISCELLANEOUS) ×8 IMPLANT
PENCIL SMOKE EVAC W/HOLSTER (ELECTROSURGICAL) ×4 IMPLANT
STRIP CLOSURE SKIN 1/2X4 (GAUZE/BANDAGES/DRESSINGS) ×3 IMPLANT
STRIP CLOSURE SKIN 1/4X3 (GAUZE/BANDAGES/DRESSINGS) ×3 IMPLANT
SUT MNCRL 0 VIOLET CTX 36 (SUTURE) ×4 IMPLANT
SUT MON AB 4-0 PS1 27 (SUTURE) ×4 IMPLANT
SUT MONOCRYL 0 CTX 36 (SUTURE) ×4
SUT PLAIN 2 0 XLH (SUTURE) ×8 IMPLANT
SUT VIC AB 0 CT1 36 (SUTURE) ×8 IMPLANT
SUT VIC AB 3-0 CT1 27 (SUTURE) ×2
SUT VIC AB 3-0 CT1 TAPERPNT 27 (SUTURE) ×2 IMPLANT
TOWEL OR 17X24 6PK STRL BLUE (TOWEL DISPOSABLE) ×16 IMPLANT
WATER STERILE IRR 1000ML POUR (IV SOLUTION) ×4 IMPLANT

## 2020-01-04 NOTE — ED Provider Notes (Signed)
MSE was initiated and I personally evaluated the patient and placed orders (if any) at  9:11 AM on January 04, 2020.  Patient is a 34 year old female with a history of premature labor she is [redacted] weeks pregnant with twins and began having vaginal bleeding cramping abdominal pain and what she describes as contractions yesterday evening that have persisted throughout the night she is very uncomfortable  She states she still has fetal movement she denies any rush of fluid although she has had bleeding and thinks that there may have been some fluid when she was sitting on the toilet earlier this morning.  No nausea or vomiting.  She does endorse pain in her back  No chest pain shortness of breath.  Vital signs without tachycardia, tachypnea or hypertension or hypotension. CONSTITUTIONAL:  well-appearing, uncomfortable NEURO:  Alert and oriented x 3, no focal deficits EYES:  pupils equal and reactive ENT/NECK:  trachea midline, no JVD CARDIO:  reg rate, reg rhythm, well-perfused PULM:  None labored breathing GI/GU:   Gravid.  No significant tenderness to palpation.  No bruising noted. MSK/SPINE:  No gross deformities, no edema SKIN:  no rash obvious, atraumatic, no ecchymosis  PSYCH:  Appropriate speech and behavior  Discussed with MAU APP Patient will be transferred over to [facility] expediently.  The patient appears stable so that the remainder of the MSE may be completed by another provider.   Solon Augusta Lingle, [place] 01/04/20 0865    Maia Plan, MD 01/07/20 1239

## 2020-01-04 NOTE — MAU Provider Note (Signed)
OB Note Patient is cephalic/cephalic with normal FHR x 2. 7/82/UMPN per CNM exam. Mg, amp, bmz, gbs, gc/ct/wet prep, uds, npo and IVF.  I d/w mom re: delivery mode if she continues to labor and r/b/a with vag delivery. I told her as long as both stay cephalic I am fine with VD which she is too. If A or B flips to non cephalic, I recommend c/s if she continues to labor.  11/18: efw 37%, 1030gm, and 1046gm, efw 41% for B.   Cornelia Copa MD Attending [facility] [employer] (Faculty Practice) 01/04/2020 Time: [phone]

## 2020-01-04 NOTE — Discharge Summary (Addendum)
Postpartum Discharge Summary  Date of Service updated 01/07/2020     Patient Name: Bonnie Mccoy DOB: [birth year] MRN: [id number]  Date of admission: 01/04/2020 Delivery date:   Mel, Tadros [[id number]]  01/04/2020    Daysy, Santini [[id number]]  01/04/2020   Delivering provider:    Beckey Downing Lemma [[id number]]  Kamayah, Pillay [[id number]]  Aletha Halim   Date of discharge: 01/07/2020  Admitting diagnosis: Preterm labor [O60.00] Intrauterine pregnancy: [redacted]w[redacted]d    Secondary diagnosis:  Active Problems:   Preterm labor   Preterm labor with preterm delivery   Cesarean delivery delivered   Vaginal delivery   Postoperative anemia due to acute blood loss  Additional problems: n/a    Discharge diagnosis: Preterm Pregnancy Delivered                                              Post partum procedures:venofer Augmentation: N/A Complications: Placental Abruption  Hospital course: Onset of Labor With Unplanned C/S   34y.o. yo [id number] admitted in Latent preterm labor w/ mono-di twins on 01/04/2020. Babies were vertex/vertex. Received BMZ x 1 and Mag Sulfate for Neuroprotection and tocolysis. Progressed despite tocolysis. SVD of twin A. Baby B was then found to be transverse and FHR dropped top 100's so STAT C/S was performed under general anesthesia. Delivery details as follows: Membrane Rupture Time/Date:    EvTorsha, Lemus0[[id number]]5:31 PM    EvTanya Nonesaith [0[[id number]]5:40 PM  ,   EvSelicia, Windom0[[id number]]01/04/2020    EvMerian, Wroe0[[id number]]01/04/2020    Delivery Method:   EvBeckey Downingaith [[id number]]Vaginal, Spontaneous    EvLaporshia, Hogen0[[id number]]C-Section, Low Transverse   Details of operation can be found in separate operative note. Patient had an uncomplicated postpartum course.  She is ambulating,tolerating a regular diet, passing flatus, and urinating well.   Patient is discharged home in stable condition 01/07/20.  Newborn Data: Birth date:   EvLajuanna, Pompa0[[id number]]01/04/2020    EvHaset, [place]0[[id number]]01/04/2020   Birth time:   EvKhamani, Daniely0[[id number]]5:37 PM    EvTanya Nonesaith [[id number]]5:50 PM   Gender:   EvGavina, Dildine0[[id number]]Female    EvSharicka, Pogorzelski0[[id number]]Female   Living status:   EvCoriana, Angello0[[id number]]  EYCXKG    YJEHU, DJSHFaith [0[[id number]]Living   Apgars:   EvBernyce, Brimley0[[id number]]4 [address]0[[id number]]5  ,   EvJoee, Iovine0[[id number]]7    EvFadumo, Henga[place]uy[place]ung[place]0[[id number]]7   Weight:   EvHayzel, Ruberg0[[id number]]1390 g    EvMarielouise, Ameyaith [0[[id number]]1310 g    Magnesium Sulfate received: Yes: Neuroprotection BMZ received: Yes  X 1  Rhophylac:N/A MMR:N/A T-DaP:Given prenatally Flu: No Transfusion:No  Physical exam  Vitals:   01/06/20 2000 01/06/20 2230 01/07/20 0514 01/07/20 0751  BP:  118/67 120/85 117/74  Pulse:  78 72 69  Resp:  16 18 18   Temp: 97.9 F (36.6 C) 97.8 F (36.6 C) 98 F (36.7 C) 97.8 F (36.6 C)  TempSrc: Oral Oral Oral Oral  SpO2:  100%  100%  Weight:      Height:       General: alert, cooperative and no distress Lochia: appropriate Uterine Fundus: firm Incision: Healing well with no significant drainage, Dressing is clean, dry, and intact DVT Evaluation: No evidence of DVT seen on physical exam. Negative Homan's sign. Labs: Lab Results  Component Value Date   WBC 28.2 (H) 01/05/2020   HGB 9.1 (L) 01/05/2020   HCT 26.6 (L) 01/05/2020   MCV 93.7 01/05/2020   PLT 352 01/05/2020   CMP Latest Ref Rng & Units 11/03/2019  Glucose 65 - 99 mg/dL 75  BUN 6 - 20 mg/dL 6  Creatinine 0.57 - 1.00 mg/dL 0.47(L)  Sodium 134 - 144 mmol/L 137  Potassium 3.5 - 5.2 mmol/L 4.0  Chloride 96 - 106 mmol/L 103  CO2 20 - 29 mmol/L 20  Calcium 8.7 - 10.2 mg/dL 9.0   Total Protein 6.0 - 8.5 g/dL 6.3  Total Bilirubin 0.0 - 1.2 mg/dL <0.2  Alkaline Phos 44 - 121 IU/L 91  AST 0 - 40 IU/L 13  ALT 0 - 32 IU/L 13   Edinburgh Score: Edinburgh Postnatal Depression Scale Screening Tool 01/05/2020  I have been able to laugh and see the funny side of things. 0  I have looked forward with enjoyment to things. 0  I have blamed myself unnecessarily when things went wrong. 3  I have been anxious or worried for no good reason. 2  I have felt scared or panicky for no good reason. 1  Things have been getting on top of me. 1  I have been so unhappy that I have had difficulty sleeping. 1  I have felt sad or miserable. 1  I have been so unhappy that I have been crying. 1  The thought of harming myself has occurred to me. 1  Edinburgh Postnatal Depression Scale Total 11     After visit meds:  Allergies as of 01/07/2020      Reactions   Bee Venom Anaphylaxis   Shellfish Allergy Anaphylaxis      Medication List    STOP taking these medications   aspirin EC 81 MG tablet   Comfort Fit Maternity Supp Lg Misc   cyclobenzaprine 10 MG tablet Commonly known as: FLEXERIL     TAKE these medications   EPINEPHrine 0.3 mg/0.3 mL Soaj injection Commonly known as: EPI-PEN Inject 0.3 mg into the muscle as needed for anaphylaxis.   ferrous sulfate 325 (65 FE) MG tablet Commonly known as: FerrouSul Take 1 tablet (325 mg total) by mouth every other day. What changed: when to take this   ibuprofen 800 MG tablet Commonly known as: ADVIL Take 1 tablet (800 mg total) by mouth every 8 (eight) hours.   oxyCODONE 5 MG immediate release tablet Commonly known as: Oxy IR/ROXICODONE Take 1-2 tablets (5-10 mg total) by mouth every 6 (six) hours as needed for up to 5 days for severe pain.   pantoprazole 40 MG tablet Commonly known as: Protonix Take 1 tablet (40 mg total) by mouth daily.   Prenatal 27-1 MG Tabs TAKE ONE TABLET BY MOUTH ONCE DAILY        Discharge  home in stable condition Infant Feeding: Bottle Infant Disposition:NICU Discharge instruction: per After Visit Summary and Postpartum booklet. Activity: Advance as tolerated. Pelvic rest for 6 weeks.  Diet: routine diet Future Appointments: Future Appointments  Date Time Provider [place]  01/09/2020  9:45 AM [facility] NURSE [facility]  01/09/2020 10:00 AM [facility]MFC US1 [facility]  01/12/2020  4:15 PM Desenglau, Tommy Rainwater, PT [facility]  01/19/2020  8:30 AM W[facility]-WOCA NURSE [facility]  01/22/2020  9:15 AM [facility]-MFC NURSE [facility]  01/22/2020  9:30 AM [facility]-MFC US3 [facility]  01/29/2020  9:00 AM [facility]-MFC NURSE [facility]  01/29/2020  9:15 AM [facility]-MFC US2 [facility]  02/02/2020  1:35 PM Griffin Basil, MD [facility]  02/05/2020  9:00 AM [facility]-MFC NURSE [facility]  02/05/2020  9:15 AM [facility]-MFC US2 [facility]  02/16/2020 10:00 AM Cozart, Birdena Jubilee, LCSW [facility] None   Follow up Visit:  [place] for [facility]. Schedule an appointment as soon as possible for a visit in 6 week(s).   Specialty: Obstetrics and Gynecology Why: postpartum in 6 weeks, wound check in 1 week Contact information: [address] [place] [address] [phone]               Please schedule this patient for a In person postpartum visit in 4 weeks with the following provider: Any provider. Additional Postpartum F/U:Incision check 1 week  High risk pregnancy complicated by: twins, Substance use Delivery mode:     Vanesa, Renier [[id number]]  Vaginal, Spontaneous    Grayson, White [[id number]]  C-Section, Low Transverse   Anticipated Birth Control:  Depo   01/07/2020 Griffin Basil, MD

## 2020-01-04 NOTE — MAU Provider Note (Signed)
Event Date/Time   First Provider Initiated Contact with Patient 01/04/20 [phone]      S Ms. Bonnie Mccoy is a 34 y.o. [phone] patient who presents to MAU via [facility] with chief complaint of painful preterm contractions which began last night. She also reports new onset vaginal bleeding.  She is pregnant with mono-di twins and receives care with [facility]. She has been NPO since last night but had a cup of coffee with cream and sugar early this morning.  O BP 118/74 (BP Location: Left Arm)   Pulse 86   Temp 97.6 F (36.4 C) (Oral)   Resp (!) 28   LMP 06/04/2019 (Within Days)   SpO2 100%    Physical Exam Vitals and nursing note reviewed. Exam conducted with a chaperone present.  Constitutional:      General: She is in acute distress.  Cardiovascular:     Rate and Rhythm: Normal rate.     Pulses: Normal pulses.  Pulmonary:     Effort: Pulmonary effort is normal.  Skin:    Capillary Refill: Capillary refill takes less than 2 seconds.  Neurological:     Mental Status: She is alert and oriented to person, place, and time.     A Medical screening exam complete Patient met by MAU team on arrival FFN collected, patient determined to be 4/80 with BBOW Preterm labor orders placed Dr. 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

## 2020-01-04 NOTE — Anesthesia Procedure Notes (Signed)
Procedure Name: Intubation Date/Time: 01/04/2020 5:45 PM Performed by: Cecile Hearing, MD Pre-anesthesia Checklist: Patient identified, Emergency Drugs available, Suction available and Patient being monitored Patient Re-evaluated:Patient Re-evaluated prior to induction Oxygen Delivery Method: Circle system utilized Preoxygenation: Pre-oxygenation with 100% oxygen Induction Type: IV induction, Rapid sequence and Cricoid Pressure applied Laryngoscope Size: Glidescope and 3 Grade View: Grade I Tube type: Oral Tube size: 7.0 mm Number of attempts: 1 Airway Equipment and Method: Stylet and Oral airway Placement Confirmation: ETT inserted through vocal cords under direct vision,  positive ETCO2 and breath sounds checked- equal and bilateral Secured at: 23 cm Tube secured with: Tape Dental Injury: Teeth and Oropharynx as per pre-operative assessment

## 2020-01-04 NOTE — Progress Notes (Signed)
Bonnie Mccoy is a 34 y.o. [id number] at [redacted]w[redacted]d.  Subjective: Feeling intermittent urge to push.   Objective: BP 128/69   Pulse 82   Temp 98 F (36.7 C) (Oral)   Resp 20   Ht 5' 4.5" (1.638 m)   Wt 81.2 kg   LMP 06/04/2019 (Within Days)   SpO2 99%   BMI 30.25 kg/m    FHT:  FHR: 135 x 2 bpm, variability: mod,  accelerations:  15x15,  decelerations:  none UC:   Q 2-5 minutes, strong Dilation: 7 Effacement (%): 100 Station: 0 Presentation: Vertex Exam by:: [place], CNM  Labs: Results for orders placed or performed during the hospital encounter of 01/04/20 (from the past 24 hour(s))  Wet prep, genital     Status: Abnormal   Collection Time: 01/04/20  9:22 AM  Result Value Ref Range   Yeast Wet Prep HPF POC NONE SEEN NONE SEEN   Trich, Wet Prep NONE SEEN NONE SEEN   Clue Cells Wet Prep HPF POC NONE SEEN NONE SEEN   WBC, Wet Prep HPF POC MODERATE (A) NONE SEEN   Sperm NONE SEEN   CBC     Status: Abnormal   Collection Time: 01/04/20  9:23 AM  Result Value Ref Range   WBC 17.0 (H) 4.0 - 10.5 K/uL   RBC 3.42 (L) 3.87 - 5.11 MIL/uL   Hemoglobin 10.9 (L) 12.0 - 15.0 g/dL   HCT 82.9 (L) 93.7 - 16.9 %   MCV 95.0 80.0 - 100.0 fL   MCH 31.9 26.0 - 34.0 pg   MCHC 33.5 30.0 - 36.0 g/dL   RDW 67.8 93.8 - 10.1 %   Platelets 389 150 - 400 K/uL   nRBC 0.0 0.0 - 0.2 %  Type and screen [facility]     Status: None   Collection Time: 01/04/20  9:23 AM  Result Value Ref Range   ABO/RH(D) O POS    Antibody Screen NEG    Sample Expiration      01/07/2020,2359 Performed at [facility], [address], [place], [place] [address]   RPR     Status: None   Collection Time: 01/04/20  9:23 AM  Result Value Ref Range   RPR Ser Ql NON REACTIVE NON REACTIVE  Resp Panel by RT-PCR (Flu A&B, Covid) Nasopharyngeal Swab     Status: None   Collection Time: 01/04/20 10:17 AM   Specimen: Nasopharyngeal Swab; Nasopharyngeal(NP) swabs in vial transport medium  Result Value Ref  Range   SARS Coronavirus 2 by RT PCR NEGATIVE NEGATIVE   Influenza A by PCR NEGATIVE NEGATIVE   Influenza B by PCR NEGATIVE NEGATIVE  Urine rapid drug screen (hosp performed)     Status: Abnormal   Collection Time: 01/04/20 11:26 AM  Result Value Ref Range   Opiates NONE DETECTED NONE DETECTED   Cocaine NONE DETECTED NONE DETECTED   Benzodiazepines NONE DETECTED NONE DETECTED   Amphetamines NONE DETECTED NONE DETECTED   Tetrahydrocannabinol POSITIVE (A) NONE DETECTED   Barbiturates NONE DETECTED NONE DETECTED    Assessment / Plan: [redacted]w[redacted]d week IUP Labor: Transition PTL Fetal Wellbeing:  Category I Pain Control:  Comfort measures Anticipated MOD:  Plan SVD, but will transfer to OR for deliver per Dr. Vergie Living in case C/S needed. NICU team updated.  Discussed again that there is always a possibility of needing STAT C/S and that if she had epidural/spinal we would be able to proceed immediately. If pt doesn't have  either and C/S is needed she would need general anesthesia and baby would likely need more resuscitation.   Katrinka Blazing, [place], [place] 01/04/2020 5:07 PM

## 2020-01-04 NOTE — H&P (Signed)
Obstetrics Admission History & Physical  01/04/2020 - 10:03 AM Primary OBGYN: [facility]  Chief Complaint: preterm labor  History of Present Illness  34 y.o. [id number] @ [redacted]w[redacted]d, with the above CC. Pregnancy complicated by: mo-di twins, ama, h/o trich, h/o drug use (2020).  Ms. Camala Talwar states that she started feeling worsening UCs last night. No s/s of ROM, VB  Patient 4/80/bbow in ob triage and scanned for cephalic x 2.   Review of Systems:  as noted in the History of Present Illness.  Patient Active Problem List   Diagnosis Date Noted  . Preterm labor 01/04/2020  . 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 Maternal iron deficiency anemia complicating pregnancy in second trimester 11/06/2019  . Trichimoniasis 11/06/2019  . AMA (advanced maternal age) multigravida 35+ 11/03/2019  . Late prenatal care affecting pregnancy 11/03/2019  . Dichorionic diamniotic twin pregnancy 11/03/2019  . Supervision of high risk pregnancy, antepartum 10/30/2019  . History of gonorrhea   . History of chlamydia   . Sciatica   . Depression   . History of preterm delivery, currently pregnant   . Drug use 02/01/2019     PMHx:  Past Medical History:  Diagnosis Date  . ADHD   . Arthritis   . Depression   . History of chlamydia   . History of gonorrhea   . Lead poisoning   . Sciatica    PSHx:  Past Surgical History:  Procedure Laterality Date  . CERVICAL BIOPSY    . COLPOSCOPY    . WISDOM TOOTH EXTRACTION     Medications:  Medications Prior to Admission  Medication Sig Dispense Refill Last Dose  . aspirin EC 81 MG tablet Take 1 tablet (81 mg total) by mouth daily. Take after 12 weeks for prevention of preeclampsia later in pregnancy 300 tablet 2   . cyclobenzaprine (FLEXERIL) 10 MG tablet TAKE 1 TABLET (10 MG TOTAL) BY MOUTH EVERY 8 (EIGHT) HOURS AS NEEDED FOR MUSCLE SPASMS. 30 tablet 1   . Elastic Bandages & Supports (COMFORT  FIT MATERNITY SUPP LG) MISC Please use maternity belt as instructed (Patient not taking: Reported on 12/16/2019) 1 each 0   . EPINEPHrine 0.3 mg/0.3 mL IJ SOAJ injection Inject 0.3 mg into the muscle as needed for anaphylaxis. 1 each 0   . ferrous sulfate (FERROUSUL) 325 (65 FE) MG tablet Take 1 tablet (325 mg total) by mouth daily with breakfast. 60 tablet 3   . pantoprazole (PROTONIX) 40 MG tablet Take 1 tablet (40 mg total) by mouth daily. 30 tablet 3   . Prenatal 27-1 MG TABS TAKE ONE TABLET BY MOUTH ONCE DAILY 30 tablet 10      Allergies: is allergic to bee venom and shellfish allergy. OBHx:  OB History  Gravida Para Term Preterm AB Living  9 4 3 1 4 4   SAB IAB Ectopic Multiple Live Births  1 3     4     # Outcome Date GA Lbr Len/2nd Weight Sex Delivery Anes PTL Lv  9 Current           8 SAB 12/2018 [redacted]w[redacted]d            Birth Comments: twins  7 IAB 2018          6 IAB 2015          5 IAB 2014          4 Term 2013 [redacted]w[redacted]d  3345  g M Vag-Spont None  LIV     Birth Comments: sciatica - belly and back brace  3 Term 2010 [redacted]w[redacted]d  3062 g F Vag-Spont None  LIV     Birth Comments: sciatica , used back and belly brace  2 Term 2007 [redacted]w[redacted]d  3992 g M Vag-Spont None  LIV     Birth Comments: bedrest from 2 months , possible preeclampsia  1 Preterm 2002 [redacted]w[redacted]d  2041 g M Vag-Spont None  LIV     Birth Comments: preterm labor started at 49 week born at 31 week         FHx:  Family History  Problem Relation Age of Onset  . Hypertension Mother   . Heart failure Mother   . Heart attack Mother    Soc Hx:  Social History   Socioeconomic History  . Marital status: Single    Spouse name: Not on file  . Number of children: Not on file  . Years of education: Not on file  . Highest education level: Not on file  Occupational History  . Not on file  Tobacco Use  . Smoking status: Current Every Day Smoker    Packs/day: 0.25    Types: Cigarettes  . Smokeless tobacco: Never Used  Vaping Use  . Vaping  Use: Never used  Substance and Sexual Activity  . Alcohol use: Not Currently    Comment: occasionally   . Drug use: Yes    Types: Marijuana, Cocaine    Comment: last used 01/04/20, last used cocaine 12/30/19  . Sexual activity: Yes    Birth control/protection: None  Other Topics Concern  . Not on file  Social History Narrative  . Not on file   Social Determinants of Health   Financial Resource Strain: Not on file  Food Insecurity: Food Insecurity Present  . Worried About Programme researcher, broadcasting/film/video in the Last Year: Sometimes true  . Ran Out of Food in the Last Year: Sometimes true  Transportation Needs: Unmet Transportation Needs  . Lack of Transportation (Medical): Yes  . Lack of Transportation (Non-Medical): Yes  Physical Activity: Not on file  Stress: Not on file  Social Connections: Not on file  Intimate Partner Violence: Not on file    Objective    Current Vital Signs 24h Vital Sign Ranges  T 97.6 F (36.4 C) Temp  Avg: 97.6 F (36.4 C)  Min: 97.6 F (36.4 C)  Max: 97.6 F (36.4 C)  BP 118/74 BP  Min: 118/74  Max: 118/74  HR 86 Pulse  Avg: 86  Min: 86  Max: 86  RR (!) 28 Resp  Avg: 28  Min: 28  Max: 28  SaO2 100 % Room Air SpO2  Avg: 100 %  Min: 100 %  Max: 100 %       24 Hour I/O Current Shift I/O  Time Ins Outs No intake/output data recorded. No intake/output data recorded.   EFM: category I x 2 with accels Toco: q3-31m  General: Well nourished, well developed female in no acute distress.  Skin:  Warm and dry.  Cardiovascular: S1, S2 normal, no murmur, rub or gallop, regular rate and rhythm Respiratory:  Clear to auscultation bilateral. Normal respiratory effort Abdomen: gravid, nttp Neuro/Psych:  Normal mood and affect.   SVE: deferred.   Labs  Pending: t&s, covid, gbs, gc/ct/wet prep, uds, ucx  Recent Labs  Lab 01/04/20 0923  WBC 17.0*  HGB 10.9*  HCT 32.5*  PLT 389  Radiology No new imaging  11/18: efw 37%, 1030gm, and 1046gm, efw 41% for B.    Assessment & Plan  Pt stable Mg and Amp (GBS unknown) started. BMZ#2 at 0930 on 12/13. NICU aware Follow up admit labs If continues to labor and stays ceph/ceph, plan is for delivery in the OR NPO. MIVF  Cornelia Copa MD Attending Center for [facility])

## 2020-01-04 NOTE — Op Note (Signed)
Operative Note   SURGERY DATE: 01/04/2020  PRE-OP DIAGNOSIS:  *Pregnancy at 30/4 *Status post vaginal delivery of Twin A *Malpresentation of Twin B (transverse) *Fetal bradycardia of Twin B *7cm cervical dilation  POST-OP DIAGNOSIS: Same. Delivered. Abruption. Dichorionic-Diamniotic twins.   PROCEDURE: Stat Primary low transverse cesarean section via pfannenstiel skin incision with double layer uterine closure  SURGEON:     [place] Bing, MD - Primary  ASSISTANT: None  ANESTHESIA: general  ESTIMATED BLOOD LOSS:  DRAINS: 17mL UOP via indwelling foley  TOTAL IV FLUIDS: crystalloid  VTE PROPHYLAXIS: SCDs to bilateral lower extremities  ANTIBIOTICS: Two grams of Cefazolin were given intraoperatively. Azithromycin 500mg  IV x 1 given after Ancef  SPECIMENS: placentas to pathology. Twin B fetus cord gases  INDICATIONS:  FINDINGS: No intra-abdominal adhesions were noted. Grossly normal uterus, tubes and ovaries. Transverse presentation for twin B, female infant, weight 1310gm, APGARs 5/7/8, intact placentas, which appeared to be separate and distinct; when delivered, they separated easily into two separate placentas, with large clot extruded with each of their deliveries.   Results for Bonnie Mccoy (MRN [place]) as of 01/05/2020 08:43  Ref. Range 01/04/2020 18:15 01/04/2020 18:15  pH cord blood (arterial) Latest Ref Range: 7.210 - 7.380   7.212  pCO2 cord blood (arterial) Latest Ref Range: 42.0 - 56.0 mmHg  42.7  Bicarbonate Latest Ref Range: 13.0 - 22.0 mmol/L 20.3 16.5  Ph Cord Blood (Venous) Latest Ref Range: 7.240 - 7.380  7.201 (L)   pCO2 Cord Blood (Venous) Latest Ref Range: 42.0 - 56.0  53.8     PROCEDURE IN DETAIL:  See Delivery Note, but in brief, the patient was taken to the OR when she was 7cm dilated and feeling intense pain and pressure; prior to this patient had been extensively counseled on the benefits of an epidural but she adamantly  declined this because of her fear of needles.  Once in the OR, the patient was near a panic attack so I did not ask her about a BTL if a c-section was needed.  She was moved over to the OR bed and placed in dorsal lithotomy, scanned again (cephalic/cephalic) and she was now anterior lip with BBOW.  Once the NICU team was assembled, her BOW was broken for clear fluid and with the next contraction, the anterior lip was gone, and she easily delivered Twin A. Cord was clamped and cut and the baby was immediately handed to the pediatricians. Another ultrasound done and baby was cephalic on ultrasound with BBOW and cervix was still completely dilated. The baby's heart rate was in the 100s so she was instructed to keep pushing. During this, the BOW broke (clear fluid), but hand and shoulder was felt; cervix was also 7cm. Given this, patient was consented for c-section and that she would need general anesthesia. Given the patient's anxiety and hyperventilating, I did not feel it appropriate to ask her about whether she wanted a BTL or not.  She was then prepped and draped in the normal fashion in the dorsal supine position with a leftward tilt.  After general anesthesia was administered and anesthesia said that she was okay for surgery, a 14/12/2019 skin incision was made with the scalpel and carried through to the underlying layer of fascia.  The rectus muscles were then separated in the midline and the peritoneum was entered bluntly. The bladder blade was inserted and the vesicouterine peritoneum was identified and a low transverse hysterotomy was made with the  scalpel until the endometrial cavity was breached and the amniotic sac ruptured. This incision was extended bluntly and the infant's head, shoulders and body were felt and the baby was still transverse.  The baby was then delivered breech in the usual fashion without difficult.  The cord was clamped x 2 and cut, and the infant was handed to the awaiting  pediatricians immediately.  The placenta was then gradually expressed from the uterus and then the placenta for twin A was expressed and delivered with the cord and clamp intact; the uterus was left in situ and then the uterus was cleared of all clots and debris. The hysterotomy was repaired with a running suture of 1-0 monocryl. A second imbricating layer of 1-0 monocryl suture was then placed to achieve excellent hemostasis.   The uterus and adnexa were then returned to the abdomen, and the hysterotomy and all operative sites were reinspected and excellent hemostasis was noted after irrigation and suction of the abdomen with warm saline.   The fascia was reapproximated with 0 Vicryl in a simple running fashion bilaterally. The subcutaneous layer was then reapproximated with interrupted sutures of 2-0 plain gut, and the skin was then closed with 4-0 monocryl, in a subcuticular fashion.  The patient  tolerated the procedure well. Sponge, lap, needle, and instrument counts were correct x 2. The patient was transferred to the recovery room awake, alert and breathing independently in stable condition.  Bonnie Copa MD Attending Center for [facility])

## 2020-01-04 NOTE — Transfer of Care (Signed)
Immediate Anesthesia Transfer of Care Note  Patient: Bonnie Mccoy  Procedure(s) Performed: VAGINAL DELIVERY (N/A ) CESAREAN SECTION (N/A Abdomen)  Patient Location: PACU  Anesthesia Type:General  Level of Consciousness: sedated  Airway & Oxygen Therapy: Patient Spontanous Breathing and Patient connected to nasal cannula oxygen  Post-op Assessment: Report given to RN and Post -op Vital signs reviewed and stable  Post vital signs: Reviewed and stable  Last Vitals:  Vitals Value Taken Time  BP    Temp    Pulse    Resp    SpO2      Last Pain:  Vitals:   01/04/20 1701  TempSrc:   PainSc: 10-Worst pain ever         Complications: No complications documented.

## 2020-01-04 NOTE — MAU Note (Signed)
Patient arrived to MAU at approximately 0922 from ED with painful ctx. Knox Saliva at bedside for cervical exam. Patient was determined 4cm. 7471 Dr. Vergie Living at bedside to determine presentation. Patient is vtx, vtx. Patient agreeable to POC. IV and labs drawn, COVID swab obtained. MAG and BMZ given.

## 2020-01-04 NOTE — Anesthesia Preprocedure Evaluation (Signed)
Anesthesia Evaluation  Patient identified by MRN, date of birth, ID band Patient awake    Reviewed: Allergy & Precautions, NPO status , Patient's Chart, lab work & pertinent test results, Unable to perform ROS - Chart review onlyPreop documentation limited or incomplete due to emergent nature of procedure.  Airway Mallampati: III  TM Distance: >3 FB Neck ROM: Full    Dental  (+) Teeth Intact, Dental Advisory Given   Pulmonary Current Smoker,    Pulmonary exam normal breath sounds clear to auscultation       Cardiovascular negative cardio ROS Normal cardiovascular exam Rhythm:Regular Rate:Normal     Neuro/Psych PSYCHIATRIC DISORDERS Depression  Neuromuscular disease    GI/Hepatic Neg liver ROS, GERD  Medicated,  Endo/Other  negative endocrine ROSObesity   Renal/GU negative Renal ROS     Musculoskeletal  (+) Arthritis ,   Abdominal   Peds  (+) ADHD Hematology negative hematology ROS (+)   Anesthesia Other Findings Day of surgery medications reviewed with the patient.  Reproductive/Obstetrics (+) Pregnancy Twins                             Anesthesia Physical Anesthesia Plan  ASA: III and emergent  Anesthesia Plan: General   Post-op Pain Management:    Induction: Intravenous, Rapid sequence and Cricoid pressure planned  PONV Risk Score and Plan: 3 and Dexamethasone, Ondansetron and Midazolam  Airway Management Planned: Oral ETT  Additional Equipment:   Intra-op Plan:   Post-operative Plan: Extubation in OR  Informed Consent: I have reviewed the patients History and Physical, chart, labs and discussed the procedure including the risks, benefits and alternatives for the proposed anesthesia with the patient or authorized representative who has indicated his/her understanding and acceptance.     Only emergency history available  Plan Discussed with: CRNA  Anesthesia Plan  Comments: (Patient attempting twin vaginal delivery in OR. Stat C-section called after 1st baby delivered for delivery of 2nd baby.)        Anesthesia Quick Evaluation

## 2020-01-04 NOTE — Progress Notes (Addendum)
Bonnie Mccoy is a 34 y.o. [phone] at [redacted]w[redacted]d by 23 week ultrasound admitted for preterm labor, pregnancy complicated by multiple gestation, late to prenatal care, AMA, pt admitted drug use, trich infection.  Subjective: Pt states that she started contracting yesterday but thought that she was just having cramping due to constipation.  She made the decision to come in this morning when she noted bleeding in the toilet and contractions that were getting stronger. Pt reports relief after dose of fentanyl, states that contractions are still very intense but tolerable.  Pt declines epidural due to phobia of needles, plan to continue with IV pain medication. Pt admits to regular marijuana use for nausea and appetite, also reports use of cocaine 6 days ago. States that previous to that use it had been months since she had used.  States that she has a significant history with depression that was worsened by the loss of her mother last year. States that she was looking into counseling and treatment and is very much interested in this. Objective: BP 121/67   Pulse 78   Temp 97.6 F (36.4 C) (Axillary)   Resp 18   Ht 5' 4.5" (1.638 m)   Wt 81.2 kg   LMP 06/04/2019 (Within Days)   SpO2 99%   BMI 30.25 kg/m  No intake/output data recorded. No intake/output data recorded.  FHT Baby A: FHR: 135 bpm, variability: moderate,  accelerations:  Present,  decelerations:  Absent FHT Baby B:  FHR: 135 bpm, variability: moderate,  accelerations:  Present,  decelerations:  Absent UC:   regular, every 2-4 minutes SVE:   Dilation: 5 Effacement (%): 90 Exam by:: A Showfety RN  Labs: Lab Results  Component Value Date   WBC 17.0 (H) 01/04/2020   HGB 10.9 (L) 01/04/2020   HCT 32.5 (L) 01/04/2020   MCV 95.0 01/04/2020   PLT 389 01/04/2020    Assessment / Plan: Spontaneous labor, progressing normally  Labor: Progressing normally Preeclampsia:  Normotensive, asymptomatic, labs WNL Fetal Wellbeing:  Category  I Pain Control:  IV pain meds  Anticipated MOD:  NSVD  Mechele Claude, SNM 01/04/2020, 12:17 PM  Attestation:  RN perfomed the exam and I agree with above. Pt likely in active preterm labor. Cervix changed from 4 to 5. Contractions continue to be regular and mod-strong even w/ Mag Sulfate infusing (primarily for neuroprotection). S/P BMZ #1. NICU aware of likelihood of PTD and substance use on pregnancy. Consult ordered.   Katrinka Blazing, [place], [place] 01/04/2020 3:19 PM

## 2020-01-05 ENCOUNTER — Encounter (HOSPITAL_COMMUNITY): Payer: Self-pay | Admitting: Obstetrics and Gynecology

## 2020-01-05 DIAGNOSIS — Z98891 History of uterine scar from previous surgery: Secondary | ICD-10-CM

## 2020-01-05 DIAGNOSIS — F1290 Cannabis use, unspecified, uncomplicated: Secondary | ICD-10-CM

## 2020-01-05 DIAGNOSIS — O99325 Drug use complicating the puerperium: Secondary | ICD-10-CM

## 2020-01-05 LAB — CBC
HCT: 26.6 % — ABNORMAL LOW (ref 36.0–46.0)
Hemoglobin: 9.1 g/dL — ABNORMAL LOW (ref 12.0–15.0)
MCH: 32 pg (ref 26.0–34.0)
MCHC: 34.2 g/dL (ref 30.0–36.0)
MCV: 93.7 fL (ref 80.0–100.0)
Platelets: 352 10*3/uL (ref 150–400)
RBC: 2.84 MIL/uL — ABNORMAL LOW (ref 3.87–5.11)
RDW: 12.7 % (ref 11.5–15.5)
WBC: 28.2 10*3/uL — ABNORMAL HIGH (ref 4.0–10.5)
nRBC: 0 % (ref 0.0–0.2)

## 2020-01-05 LAB — GC/CHLAMYDIA PROBE AMP (~~LOC~~) NOT AT ARMC
Chlamydia: NEGATIVE
Comment: NEGATIVE
Comment: NORMAL
Neisseria Gonorrhea: NEGATIVE

## 2020-01-05 MED ORDER — MEDROXYPROGESTERONE ACETATE 150 MG/ML IM SUSP
150.0000 mg | Freq: Once | INTRAMUSCULAR | Status: AC
  Administered 2020-01-07: 150 mg via INTRAMUSCULAR
  Filled 2020-01-05: qty 1

## 2020-01-05 NOTE — Lactation Note (Signed)
This note was copied from a baby's chart. Lactation Consultation Note NICU babies 8 hrs old.  Attempted to see mom. Mom sleeping soundly w/FOB snuggled in bed w/her. DEBP set up at bedside w/colostrum containers and milk labels in rm.  Spoke w/RN. RN had set up pump. RN stated mom pumped and then hand expressed a little colostrum. Colostrum container in refrigerator. Let NICU and Lactation book at bedside. Lactation will try again later.  Patient Name: Keondra Haydu Today's Date: 01/05/2020     Maternal Data    Feeding    LATCH Score                   Interventions    Lactation Tools Discussed/Used     Consult Status      Charyl Dancer 01/05/2020, 3:18 AM

## 2020-01-05 NOTE — Clinical Social Work Maternal (Signed)
CLINICAL SOCIAL WORK MATERNAL/CHILD NOTE  Patient Details  Name: Bonnie Mccoy MRN: [id number] Date of Birth: [birth year]  Date:  01/05/2020  Clinical Social Worker Initiating Note:  Laurey Arrow Date/Time: Initiated:  01/05/20/1157     Child's Name:  Bonnie Mccoy   Biological Parents:  Mother,Father (FOB is [facility] 09/16/1983 [phone])   Need for Interpreter:  None,Greek   Reason for Referral:  [place] of Premature Babies < 32 weeks/or Critically Ill babies,Current Substance Use/Substance Use During Pregnancy    Address:  [place] [address]    Phone number:  [phone] (home)     Additional phone number: FOB's number is [phone]  Household Members/Support Persons (HM/SP):   Household Member/Support Person 1,Household Member/Support Person 2,Household Member/Support Person 3   HM/SP Name Relationship DOB or Age  HM/SP -[place] son 10/29/2011  HM/SP -2 [place] Lacinda Axon son 07/31/2005  HM/SP -3 Luberta Mutter 02/14/2008 daughter  HM/SP -4        HM/SP -5        HM/SP -6        HM/SP -7        HM/SP -8          Natural Supports (not living in the home):  Immediate Family,Neighbors,Spouse/significant other   Professional Supports: Transport planner (MOB reported that she receives outpatient [facility] at the [employer] is therapist Roselyn Reef.)   Employment: Unemployed   Type of Work:     Education:  9 to 11 years (MOB reported that she completed the 10th grade.)   Homebound arranged: No  Financial Resources:  [employer]   Other Resources:  WIC,Food Corporate treasurer Considerations Which May Impact Care:  None Reported  Strengths:  Ability to meet basic needs ,Pediatrician chosen,Home prepared for child ,Understanding of illness   Psychotropic Medications:         Pediatrician:    [place] area  Pediatrician List:   [facility] ([address])  [place]      Pediatrician Fax Number:    Risk Factors/Current Problems:  Transportation ,Mental Health Concerns ,Substance Use    Cognitive State:  Alert ,Insightful ,Linear Thinking    Mood/Affect:  Interested ,Comfortable ,Happy ,Relaxed    CSW Assessment: CSW met with MOB in room 107 to complete an assessment for MH hx, SA, Edinburgh Score of 11, and NICU admission. When CSW arrived, FOB was helping MOB get comfortable in the bed. CSW explained CSW's role and with MOB's permission, CSW asked FOB to leave in order to assess MOB in private; FOB left without incident. MOB was polite, easy to engage, and receptive to meeting with CSW.   CSW asked MOB to share her story of labor and delivery as well as twins admission to NICU and how she felt emotionally throughout her experience.  MOB was open to talking with CSW and sharing her feelings.  She states twins admission to NICU was "bittersweet" because she didn't want to be away from the them, but "knew  they would get the proper care there (NICU)."  MOB is keeping this (necessary care twins is receiving) as her focus and acknowledges the situation as temporary.  CSW assisted her in identifying strengths, which she was able to.  She is pleased with twins care.  CSW  asked about MOB's MH hx and MOB openly shared her hx with anxiety and depression.  Per MOB, MOB was dx over 4 years ago and at one point was on medication.  MOB shared that she is not currently taking any medication but attends scheduled outpatient therapy with [facility] outpatient therapist, Roselyn Reef. CSW provided education regarding PPD signs and symptoms to watch for and asked that MOB commit to talking with CSW and or her doctor if symptoms arise at any time; she agreed.  CSW also discussed common emotions often experienced during the first two weeks after delivery, keeping  in mind the separation that is inevitably caused by twins admission to NICU.  MOB also acknowledged PPD with MOB's her son in 2007.  Per MOB, MOB's symptoms were managed with medication short term and outpatient counseling was continuous and until her symptoms subsided. MOB presented with insight and awareness and did not demonstrate any acute MH symptoms. CSW assessed for safety and MOB denied SI, HI, and DV.  MOB reported having a good support team that primary consists of FOB and FOB's family.   CSW asked about MOB's SA hx and MOB acknowledged the use of marijuana and cocaine during her pregnancy.  Per MOB her last use of cocaine was about 1 week ago and her last use of THC was about 2 day ago. MOB shared, "I just used a small amount of coccinea to help me feel better.  I was feelings sad and overwhelmed."  MOB also shared using marijuana to help increase her appetite. CSW processed other interventions that MOB can engage in to help increase her mood and MOB agreed.  CSW offered resources for SA counseling and MOB declined. CSW shared with MOB the hospital's policy regarding infant's substance exposure and MOB was understanding.  MOB is aware that twins UDS were negative and CSW will continue to monitor their CDS results and will make a report to [place] if warranted.  MOB denied having any CPS hx.   MOB reported having little to no essential supplies for infant.  MOB is aware to communicate with CSW if MOB and FOB unable to secure car seats and beds for twins prior to their discharge.   CSW will continue to offer resources and supports to family while infant remains in NICU.   CSW Plan/Description:  Psychosocial Support and Ongoing Assessment of Needs,Sudden Infant Death Syndrome (SIDS) Education,Perinatal Mood and Anxiety Disorder (PMADs) Education,Hospital Drug Screen Policy Information,Other Information/Referral to [employer] Will Continue to Monitor Umbilical Cord Tissue Drug  Screen Results and Make Report if Warranted   Laurey Arrow, MSW, LCSW Clinical Social Work [phone]  Dimple Nanas, LCSW 01/05/2020, 12:08 PM

## 2020-01-05 NOTE — Lactation Note (Addendum)
This note was copied from a baby's chart. Lactation Consultation Note  Patient Name: Bonnie Mccoy QQPYP'P Date: 01/05/2020 Reason for consult: Follow-up assessment   P5 mother whose infant twin girls are now 20 hours old.  These babies are preterm infants born at 30+4 weeks weighing 3 lbs- 1 oz and 2 lbs -14 oz and in the NICU.  Mother was resting when I arrived; awaiting lunch.  She had been to the NICU to visit and recently pumped in the NICU. Mother was not able to obtain any EBM at that time; reassurance given that this is typical and to continue to massage and pump.  Reviewed pumping expectations with mother.  Stressed the importance of hand expression before/after pumping and to be diligent with maintaining a consistent pumping schedule every three hours.  Mother verbalized understanding.  She has colostrum containers and labels at the bedside.  Mother will call for any questions/concerns.  No support person present at this time.       Maternal Data    Feeding    LATCH Score                   Interventions    Lactation Tools Discussed/Used     Consult Status Consult Status: Follow-up Date: 01/06/20 Follow-up type: In-patient    [name] [name] 01/05/2020, 2:30 PM

## 2020-01-05 NOTE — Lactation Note (Signed)
This note was copied from a baby's chart. Lactation Consultation Note  Patient Name: Bonnie Mccoy TRRNH'A Date: 01/05/2020 Reason for consult: Follow-up assessment   LC Follow Up Visit:  Attempted to visit with mother on 1st floor and also in the NICU; mother not present in either room.  Will return later today.   Maternal Data    Feeding    LATCH Score                   Interventions    Lactation Tools Discussed/Used     Consult Status Consult Status: Follow-up Date: 01/06/20 Follow-up type: In-patient    Dora Sims 01/05/2020, 12:17 PM

## 2020-01-05 NOTE — Progress Notes (Signed)
POSTPARTUM PROGRESS NOTE  POD #1  Subjective:  Bonnie Mccoy is a 34 y.o. [id number] s/p LTCS at [redacted]w[redacted]d.  She reports she doing well. No acute events overnight. She denies any problems with ambulating, voiding or po intake. Denies nausea or vomiting. She has  passed flatus. Pain is well controlled.  Lochia is normal.  Pt is tolerating regular diet.  Long discussion with patient regarding drug use.  She notes using primarily THC, and rare use of cocaine.  Per pt the last use of cocaine may have been more toward the end of November.  Objective: Blood pressure 103/68, pulse 84, temperature 98 F (36.7 C), temperature source Oral, resp. rate 19, height 5' 4.5" (1.638 m), weight 81.2 kg, last menstrual period 06/04/2019, SpO2 100 %, unknown if currently breastfeeding.  Physical Exam:  General: alert, cooperative and no distress Chest: no respiratory distress Heart:regular rate, distal pulses intact Abdomen: soft, nontender,  Uterine Fundus: firm, appropriately tender DVT Evaluation: No calf swelling or tenderness Extremities:  edema Skin: warm, dry; incision clean/dry/intact w/ honeycomb dressing in place Pressure dressing still in place.  Recent Labs    01/04/20 0923 01/05/20 0505  HGB 10.9* 9.1*  HCT 32.5* 26.6*    Assessment/Plan: Bonnie Mccoy is a 34 y.o. [id number] s/p primary cesarean section at [redacted]w[redacted]d for fetal malpresentation for Baby B and abruption.  POD#1 - Pain well controlled Pt tolerating regular diet Increase ambulation Social work consult ordered.  LOS: 1 day   Mariel Aloe, MD Faculty attending, [facility] [employer]. 01/05/2020, 2:51 PM

## 2020-01-05 NOTE — Progress Notes (Signed)
Pt spotted outside and headed to side of street  Informed pt not to leave grounds and she is not permitted to smoke on property . Pt apologized and stated she saw other pts outside also .she has been informed where she can go .

## 2020-01-05 NOTE — Lactation Note (Signed)
This note was copied from a baby's chart. Lactation Consultation Note  Patient Name: Bonnie Mccoy Today's Date: 01/05/2020     Maternal Data    Feeding    LATCH Score                   Interventions    Lactation Tools Discussed/Used     Consult Status      Irene Pap Mustapha Colson 01/05/2020, 2:37 PM

## 2020-01-06 DIAGNOSIS — D62 Acute posthemorrhagic anemia: Secondary | ICD-10-CM | POA: Diagnosis not present

## 2020-01-06 LAB — CULTURE, BETA STREP (GROUP B ONLY)

## 2020-01-06 MED ORDER — SODIUM CHLORIDE 0.9 % IV SOLN
500.0000 mg | Freq: Once | INTRAVENOUS | Status: AC
  Administered 2020-01-06: 500 mg via INTRAVENOUS
  Filled 2020-01-06: qty 25

## 2020-01-06 NOTE — Progress Notes (Signed)
POSTPARTUM PROGRESS NOTE POD #2  Subjective:  Bonnie Mccoy is a 34 y.o. [id number] s/p LTCS at [redacted]w[redacted]d.  She reports she doing well. No acute events overnight. She denies any problems with ambulating, voiding or po intake. Denies nausea or vomiting. She has passed flatus but no BM yet. Pain is well controlled.  Lochia is normal.  Pt is tolerating regular diet.  Objective: Blood pressure 99/64, pulse 77, temperature 98.5 F (36.9 C), temperature source Oral, resp. rate 18, height 5' 4.5" (1.638 m), weight 81.2 kg, last menstrual period 06/04/2019, SpO2 99 %, unknown if currently breastfeeding.  Physical Exam:  General: alert, cooperative and no distress Abdomen: soft, nontender,  Uterine Fundus: firm, appropriately tender DVT Evaluation: No calf swelling or tenderness Extremities:  edema Skin: warm, dry; incision clean/dry/intact w/ honeycomb dressing in place Pressure dressing still in place.  Recent Labs    01/04/20 0923 01/05/20 0505  HGB 10.9* 9.1*  HCT 32.5* 26.6*    Assessment/Plan: Bonnie Mccoy is a 34 y.o. T7D2202 s/p primary cesarean section at [redacted]w[redacted]d for fetal malpresentation for Baby B and abruption.  POD#2 - Pain well controlled Pt tolerating regular diet Counseled about postoperative anemia, patient desires IV iron. Venofer ordered Social work consult done, appreciate her recommendations. Depo Provera at discharge Routine postpartum care   LOS: 2 days     Jaynie Collins, MD, FACOG Obstetrician & Gynecologist, [facility] for [facility], [facility] 01/06/2020, 6:24 AM

## 2020-01-06 NOTE — Progress Notes (Signed)
POSTPARTUM PROGRESS NOTE POD #2  Subjective:  Bonnie Mccoy is a 34 y.o. [id number] s/p LTCS at [redacted]w[redacted]d.  She reports she doing well. No acute events overnight. She denies any problems with ambulating, voiding or po intake. Denies nausea or vomiting. She has passed flatus but no BM yet. Pain is well controlled.  Lochia is normal.  Pt is tolerating regular diet.  Objective: Blood pressure 99/64, pulse 77, temperature 98.5 F (36.9 C), temperature source Oral, resp. rate 18, height 5' 4.5" (1.638 m), weight 81.2 kg, last menstrual period 06/04/2019, SpO2 99 %, unknown if currently breastfeeding.  Physical Exam:  General: alert, cooperative and no distress Abdomen: soft, nontender,  Uterine Fundus: firm, appropriately tender DVT Evaluation: No calf swelling or tenderness Extremities:  edema Skin: warm, dry; incision clean/dry/intact w/ honeycomb dressing in place.  Recent Labs    01/04/20 0923 01/05/20 0505  HGB 10.9* 9.1*  HCT 32.5* 26.6*    Assessment/Plan: Bonnie Mccoy is a 34 y.o. Q2S6015 s/p primary cesarean section at [redacted]w[redacted]d for fetal malpresentation for Baby B and abruption.  POD#2 - Pain well controlled Pt tolerating regular diet Counseled about postoperative anemia, patient desires IV iron. Venofer ordered Social work consult done, appreciate her recommendations. Depo Provera at discharge Routine postpartum care   LOS: 2 days     Jaynie Collins, MD, FACOG Obstetrician & Gynecologist, [facility] for [facility], [facility] 01/06/2020, 6:33 AM

## 2020-01-06 NOTE — Lactation Note (Signed)
This note was copied from a baby's chart. Lactation Consultation Note  Patient Name: Bonnie Mccoy Today's Date: 01/06/2020    Follow Up Visit:  Attempted to visit with mother, however, she was not in her room at this time.  Will follow up in NICU for a visit.   Maternal Data    Feeding Feeding Type: Donor Breast Milk  LATCH Score                   Interventions    Lactation Tools Discussed/Used     Consult Status      Bonnie Mccoy 01/06/2020, 7:37 AM

## 2020-01-06 NOTE — Lactation Note (Signed)
This note was copied from a baby's chart. Lactation Consultation Note  Patient Name: Bonnie Mccoy [id number] Date: 01/06/2020 Reason for consult: Follow-up assessment  P5 mother whose infant twin girls are now 67 hours old.  These babies are preterm infants born at 30+4 weeks with a CGA of 30+6 weeks and in the NICU.  Mother was happy to report that neither baby had any oxygen requirements today and that their og tubes have been changed to ng tubes.  Mother excited to show me photos and relay information regarding their progress.  Provided emotional support and continued encouragement.  Mother was receiving an iron infusion when I arrived.  She had not pumped since early morning.  Emphasized the importance of getting back to a consistent pumping routine as soon as she finished lunch.  Mother verbalized understanding.  She has been ambulating and had no complaints of pain.  She is continuing to massage and hand express and was able to obtain a few mls of EBM with the last pumping session.  She has containers for her EBM.  WIC representative made a follow up call to mother this morning and she will be able to obtain a DEBP at discharge.  Per mother, [facility] will be calling her every day until discharge.  Mother appreciative of assistance provided.  Support person visiting when he can due to other children at home.  RN updated.   Maternal Data    Feeding Feeding Type: Donor Breast Milk  LATCH Score                   Interventions    Lactation Tools Discussed/Used     Consult Status Consult Status: Follow-up Date: 01/07/20 Follow-up type: In-patient    Bonnie Mccoy 01/06/2020, 12:51 PM

## 2020-01-07 ENCOUNTER — Other Ambulatory Visit (HOSPITAL_COMMUNITY): Payer: Self-pay | Admitting: Obstetrics and Gynecology

## 2020-01-07 LAB — SURGICAL PATHOLOGY

## 2020-01-07 MED ORDER — FERROUS SULFATE 325 (65 FE) MG PO TABS: 325.0000 mg | ORAL_TABLET | ORAL | 3 refills | Status: DC

## 2020-01-07 MED ORDER — OXYCODONE HCL 5 MG PO TABS: 5.0000 mg | ORAL_TABLET | Freq: Four times a day (QID) | ORAL | 0 refills | Status: DC | PRN

## 2020-01-07 MED ORDER — IBUPROFEN 800 MG PO TABS: 800.0000 mg | ORAL_TABLET | Freq: Three times a day (TID) | ORAL | 2 refills | Status: DC

## 2020-01-07 MED FILL — oxyCODONE HCL 5 MG TABS: 5 | 5 days supply | Qty: 24 | Fill #0

## 2020-01-07 MED FILL — IBUPROFEN 800 MG TAB: 800 | 10 days supply | Qty: 30 | Fill #0

## 2020-01-07 MED FILL — FERROUS SULFATE 325 MG TAB: 325 (65 FE) | 120 days supply | Qty: 60 | Fill #0

## 2020-01-07 NOTE — Plan of Care (Signed)
  Problem: Health Behavior/Discharge Planning: Goal: Ability to manage health-related needs will improve Outcome: Adequate for Discharge   Problem: Clinical Measurements: Goal: Ability to maintain clinical measurements within normal limits will improve Outcome: Adequate for Discharge Goal: Will remain free from infection Outcome: Adequate for Discharge Goal: Diagnostic test results will improve Outcome: Adequate for Discharge Goal: Respiratory complications will improve Outcome: Adequate for Discharge Goal: Cardiovascular complication will be avoided Outcome: Adequate for Discharge   Problem: Activity: Goal: Risk for activity intolerance will decrease Outcome: Adequate for Discharge   Problem: Nutrition: Goal: Adequate nutrition will be maintained Outcome: Adequate for Discharge   Problem: Coping: Goal: Level of anxiety will decrease Outcome: Adequate for Discharge   Problem: Elimination: Goal: Will not experience complications related to bowel motility Outcome: Adequate for Discharge Goal: Will not experience complications related to urinary retention Outcome: Adequate for Discharge   Problem: Pain Managment: Goal: General experience of comfort will improve Outcome: Adequate for Discharge   Problem: Safety: Goal: Ability to remain free from injury will improve Outcome: Adequate for Discharge   Problem: Skin Integrity: Goal: Risk for impaired skin integrity will decrease Outcome: Adequate for Discharge   Problem: Education: Goal: Knowledge of condition will improve Outcome: Adequate for Discharge Goal: Individualized Educational Video(s) Outcome: Adequate for Discharge Goal: Individualized Newborn Educational Video(s) Outcome: Adequate for Discharge   Problem: Activity: Goal: Will verbalize the importance of balancing activity with adequate rest periods Outcome: Adequate for Discharge Goal: Ability to tolerate increased activity will improve Outcome: Adequate  for Discharge   Problem: Coping: Goal: Ability to identify and utilize available resources and services will improve Outcome: Adequate for Discharge   Problem: Life Cycle: Goal: Chance of risk for complications during the postpartum period will decrease Outcome: Adequate for Discharge   Problem: Role Relationship: Goal: Ability to demonstrate positive interaction with newborn will improve Outcome: Adequate for Discharge   Problem: Skin Integrity: Goal: Demonstration of wound healing without infection will improve Outcome: Adequate for Discharge

## 2020-01-07 NOTE — Lactation Note (Signed)
This note was copied from a baby's chart. Lactation Consultation Note  Patient Name: Bonnie Mccoy [id number] Date: 01/07/2020 Reason for consult: Follow-up assessment;NICU baby;Multiple gestation  Parents returning from NICU from seeing their 30+4 week Twins girls. Mother reports that infants are doing good. She reports that the infants were able to be placed STS.   Mother reports that she was a little behind on her pumping schedule. She is swollen and and her breast are leaking. Mother was advised to massage breast well and ice for 15 mins every 3-4 hours. Mother was given ice packs .  She plans to rest while waiting on her DR to come and the go back to the NICU, mother reports that she plans to get her pump from the [facility] office when she is discharged.   Mother has pumped several colostrum bottles full. She is aware that she can pump in the NICU as well. Maternal Data    Feeding Feeding Type: Donor Breast Milk  LATCH Score                   Interventions    Lactation Tools Discussed/Used     Consult Status Consult Status: Follow-up Date: 01/08/20 Follow-up type: In-patient    Stevan Born [facility] 01/07/2020, 10:03 AM

## 2020-01-07 NOTE — Progress Notes (Signed)
POSTPARTUM PROGRESS NOTE  POD #3  Subjective:  Bonnie Mccoy is a 34 y.o. T5L2174 s/p primary LTCS at [redacted]w[redacted]d.  She reports she doing well. No acute events overnight. She denies any problems with ambulating, voiding or po intake. Denies nausea or vomiting. She has  passed flatus and had a bowel movement. Pain is well controlled.  Lochia is normal.  Objective: Blood pressure 117/74, pulse 69, temperature 97.8 F (36.6 C), temperature source Oral, resp. rate 18, height 5' 4.5" (1.638 m), weight 81.2 kg, last menstrual period 06/04/2019, SpO2 100 %, unknown if currently breastfeeding.  Physical Exam:  General: alert, cooperative and no distress Chest: no respiratory distress Heart:regular rate, distal pulses intact Abdomen: soft, nontender,  Uterine Fundus: firm, appropriately tender DVT Evaluation: No calf swelling or tenderness Extremities: minimal edema Skin: warm, dry; incision clean/dry/intact w/ honeycomb dressing in place  Recent Labs    01/05/20 0505  HGB 9.1*  HCT 26.6*    Assessment/Plan: Bonnie Mccoy is a 34 y.o. J1T9539 s/p primary cesarean section at [redacted]w[redacted]d for preterm labor and fetal malpresentation.Marland Kitchen  POD#3 -  Contraception: depo provera Feeding: breast  Dispo: d/c home  LOS: 3 days   Mariel Aloe, Md Faculty Attending, [facility] [employer] 01/07/2020, 10:20 AM

## 2020-01-07 NOTE — Discharge Instructions (Signed)

## 2020-01-09 ENCOUNTER — Ambulatory Visit: Payer: Medicaid Other

## 2020-01-12 ENCOUNTER — Encounter: Payer: Medicaid Other | Admitting: Obstetrics & Gynecology

## 2020-01-12 ENCOUNTER — Telehealth: Payer: Self-pay | Admitting: Clinical

## 2020-01-12 ENCOUNTER — Ambulatory Visit: Payer: Medicaid Other | Admitting: Physical Therapy

## 2020-01-12 NOTE — Anesthesia Postprocedure Evaluation (Signed)
Anesthesia Post Note  Patient: Bonnie Mccoy  Procedure(s) Performed: VAGINAL DELIVERY (N/A ) CESAREAN SECTION (N/A Abdomen)     Patient location during evaluation: PACU Anesthesia Type: General Level of consciousness: awake and sedated Pain management: pain level controlled Vital Signs Assessment: post-procedure vital signs reviewed and stable Respiratory status: spontaneous breathing Cardiovascular status: stable Postop Assessment: no apparent nausea or vomiting Anesthetic complications: no   No complications documented.  Last Vitals:  Vitals:   01/07/20 0751 01/07/20 1311  BP: 117/74 111/69  Pulse: 69 66  Resp: 18 17  Temp: 36.6 C 36.6 C  SpO2: 100% 100%    Last Pain:  Vitals:   01/07/20 1311  TempSrc: Oral  PainSc:    Pain Goal: Patients Stated Pain Goal: 3 (01/07/20 0757)                 Caren Macadam

## 2020-01-12 NOTE — Telephone Encounter (Signed)
Call to patient to congratulate on birth of twins, and to reschedule follow up virtual visit postpartum; pt agrees to virtual visit on 01/29/19 at 2:15pm.

## 2020-01-19 ENCOUNTER — Encounter: Payer: Self-pay | Admitting: Lactation Services

## 2020-01-19 ENCOUNTER — Other Ambulatory Visit: Payer: Self-pay

## 2020-01-19 ENCOUNTER — Ambulatory Visit (INDEPENDENT_AMBULATORY_CARE_PROVIDER_SITE_OTHER): Payer: Medicaid Other | Admitting: Lactation Services

## 2020-01-19 VITALS — BP 117/79 | HR 57 | Ht 64.5 in | Wt 161.6 lb

## 2020-01-19 DIAGNOSIS — Z4889 Encounter for other specified surgical aftercare: Secondary | ICD-10-CM

## 2020-01-19 MED ORDER — PRENATAL 27-1 MG PO TABS: 1.0000 | ORAL_TABLET | Freq: Every day | ORAL | 10 refills | Status: AC

## 2020-01-19 NOTE — Progress Notes (Addendum)
Patient here for PP would check. Wound well approximated with no drainage, redness, swelling, or excessive warmth. Steri strips removed intact.   She report transportation issues, she has a bus pass now. Patient is seeing Bonnie Mccoy as needed.   Mom reports small appetite, reviewed eating small frequent snacks.   She is pumping for babies. Babies are in the NICU. She is trying to pump about every 3 hours, she sometimes is too busy. Reviewed supply and demand.

## 2020-01-21 NOTE — BH Specialist Note (Unsigned)
Integrated Behavioral Health via Telemedicine Visit  01/21/2020 Bonnie Mccoy [id number]  Number of Integrated Behavioral Health visits: *** Session Start time: 2:15***  Session End time: 2:45*** Total time: {IBH Total Time:21014050}  Referring Provider: *** Patient/Family location: *** [facility] Provider location: *** All persons participating in visit: *** Types of Service: {CHL AMB TYPE OF SERVICE:[phone]}  I connected with Bonnie Mccoy and/or Bonnie Mccoy's {family members:20773} by Telephone  (Video is Caregility application) and verified that I am speaking with the correct person using two identifiers.Discussed confidentiality: {YES/NO:21197}  I discussed the limitations of telemedicine and the availability of in person appointments.  Discussed there is a possibility of technology failure and discussed alternative modes of communication if that failure occurs.  I discussed that engaging in this telemedicine visit, they consent to the provision of behavioral healthcare and the services will be billed under their insurance.  Patient and/or legal guardian expressed understanding and consented to Telemedicine visit: {YES/NO:21197}  Presenting Concerns: Patient and/or family reports the following symptoms/concerns: *** Duration of problem: ***; Severity of problem: {Mild/Moderate/Severe:20260}  Patient and/or Family's Strengths/Protective Factors: {CHL AMB BH PROTECTIVE [phone]}  Goals Addressed: Patient will: 1.  Reduce symptoms of: {IBH Symptoms:21014056}  2.  Increase knowledge and/or ability of: {IBH Patient Tools:21014057}  3.  Demonstrate ability to: {IBH Goals:21014053}  Progress towards Goals: {CHL AMB BH PROGRESS TOWARDS GOALS:[phone]}  Interventions: Interventions utilized:  {IBH Interventions:21014054} Standardized Assessments completed: {IBH Screening Tools:21014051}  Patient and/or Family Response: ***  Assessment: Patient currently experiencing  ***.   Patient may benefit from ***.  Plan: 1. Follow up with behavioral health clinician on : *** 2. Behavioral recommendations: *** 3. Referral(s): {IBH Referrals:21014055}  I discussed the assessment and treatment plan with the patient and/or parent/guardian. They were provided an opportunity to ask questions and all were answered. They agreed with the plan and demonstrated an understanding of the instructions.   They were advised to call back or seek an in-person evaluation if the symptoms worsen or if the condition fails to improve as anticipated.  Bonnie Mccoy Aris Moman, LCSW

## 2020-01-22 ENCOUNTER — Ambulatory Visit: Payer: Medicaid Other

## 2020-01-23 ENCOUNTER — Other Ambulatory Visit: Payer: Self-pay | Admitting: Obstetrics & Gynecology

## 2020-01-23 DIAGNOSIS — M549 Dorsalgia, unspecified: Secondary | ICD-10-CM

## 2020-01-23 DIAGNOSIS — O99891 Other specified diseases and conditions complicating pregnancy: Secondary | ICD-10-CM

## 2020-01-29 ENCOUNTER — Ambulatory Visit: Payer: Medicaid Other

## 2020-01-29 ENCOUNTER — Encounter: Payer: Medicaid Other | Admitting: Clinical

## 2020-01-30 ENCOUNTER — Emergency Department (HOSPITAL_COMMUNITY): Payer: Medicaid Other

## 2020-01-30 ENCOUNTER — Other Ambulatory Visit: Payer: Self-pay

## 2020-01-30 ENCOUNTER — Emergency Department (HOSPITAL_COMMUNITY)
Admission: EM | Admit: 2020-01-30 | Discharge: 2020-01-30 | Disposition: A | Discharge status: Home / Self Care | Payer: Medicaid Other | Attending: Emergency Medicine | Admitting: Emergency Medicine

## 2020-01-30 ENCOUNTER — Encounter (HOSPITAL_COMMUNITY): Payer: Self-pay | Admitting: Emergency Medicine

## 2020-01-30 DIAGNOSIS — S60222A Contusion of left hand, initial encounter: Secondary | ICD-10-CM | POA: Insufficient documentation

## 2020-01-30 DIAGNOSIS — F17210 Nicotine dependence, cigarettes, uncomplicated: Secondary | ICD-10-CM | POA: Insufficient documentation

## 2020-01-30 DIAGNOSIS — T7491XA Unspecified adult maltreatment, confirmed, initial encounter: Secondary | ICD-10-CM

## 2020-01-30 DIAGNOSIS — T7411XA Adult physical abuse, confirmed, initial encounter: Secondary | ICD-10-CM | POA: Diagnosis not present

## 2020-01-30 DIAGNOSIS — S40012A Contusion of left shoulder, initial encounter: Secondary | ICD-10-CM | POA: Diagnosis not present

## 2020-01-30 DIAGNOSIS — S0990XA Unspecified injury of head, initial encounter: Secondary | ICD-10-CM | POA: Diagnosis present

## 2020-01-30 DIAGNOSIS — S0093XA Contusion of unspecified part of head, initial encounter: Secondary | ICD-10-CM | POA: Diagnosis not present

## 2020-01-30 DIAGNOSIS — F0781 Postconcussional syndrome: Secondary | ICD-10-CM | POA: Insufficient documentation

## 2020-01-30 DIAGNOSIS — T07XXXA Unspecified multiple injuries, initial encounter: Secondary | ICD-10-CM

## 2020-01-30 LAB — CBC WITH DIFFERENTIAL/PLATELET
Abs Immature Granulocytes: 0.07 10*3/uL (ref 0.00–0.07)
Basophils Absolute: 0.1 10*3/uL (ref 0.0–0.1)
Basophils Relative: 1 %
Eosinophils Absolute: 0.1 10*3/uL (ref 0.0–0.5)
Eosinophils Relative: 1 %
HCT: 40.2 % (ref 36.0–46.0)
Hemoglobin: 13.6 g/dL (ref 12.0–15.0)
Immature Granulocytes: 1 %
Lymphocytes Relative: 16 %
Lymphs Abs: 2.2 10*3/uL (ref 0.7–4.0)
MCH: 32.2 pg (ref 26.0–34.0)
MCHC: 33.8 g/dL (ref 30.0–36.0)
MCV: 95.3 fL (ref 80.0–100.0)
Monocytes Absolute: 0.7 10*3/uL (ref 0.1–1.0)
Monocytes Relative: 5 %
Neutro Abs: 10.1 10*3/uL — ABNORMAL HIGH (ref 1.7–7.7)
Neutrophils Relative %: 76 %
Platelets: 331 10*3/uL (ref 150–400)
RBC: 4.22 MIL/uL (ref 3.87–5.11)
RDW: 12.6 % (ref 11.5–15.5)
WBC: 13.2 10*3/uL — ABNORMAL HIGH (ref 4.0–10.5)
nRBC: 0 % (ref 0.0–0.2)

## 2020-01-30 LAB — BASIC METABOLIC PANEL
Anion gap: 12 (ref 5–15)
BUN: 10 mg/dL (ref 6–20)
CO2: 18 mmol/L — ABNORMAL LOW (ref 22–32)
Calcium: 8.7 mg/dL — ABNORMAL LOW (ref 8.9–10.3)
Chloride: 107 mmol/L (ref 98–111)
Creatinine, Ser: 0.66 mg/dL (ref 0.44–1.00)
GFR, Estimated: >60 mL/min (ref >60)
Glucose, Bld: 109 mg/dL — ABNORMAL HIGH (ref 70–99)
Potassium: 3.7 mmol/L (ref 3.5–5.1)
Sodium: 137 mmol/L (ref 135–145)

## 2020-01-30 MED ORDER — ACETAMINOPHEN 325 MG PO TABS
650.0000 mg | ORAL_TABLET | Freq: Once | ORAL | Status: AC
  Administered 2020-01-30: 650 mg via ORAL
  Filled 2020-01-30: qty 2
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
## 2020-01-30 NOTE — ED Notes (Signed)
Patient drank fluids tolerated well

## 2020-01-30 NOTE — BH Specialist Note (Deleted)
Integrated Behavioral Health via Telemedicine Visit  01/30/2020 Bonnie Mccoy [id number]  Number of Integrated Behavioral Health visits: 4 Session Start time: 8:45***  Session End time: 9:15*** Total time: {IBH Total Time:21014050}  Referring Provider: *** Patient/Family location: Home*** [facility] Provider location: [facility]  All persons participating in visit: Patient *** and Bonnie Mccoy Bonnie Mccoy ***  Types of Service: {CHL AMB TYPE OF SERVICE:[phone]}  I connected with Bonnie Mccoy and/or Bonnie Mccoy's {family members:20773} by Telephone  (Video is Caregility application) and verified that I am speaking with the correct person using two identifiers.Discussed confidentiality: {YES/NO:21197}  I discussed the limitations of telemedicine and the availability of in person appointments.  Discussed there is a possibility of technology failure and discussed alternative modes of communication if that failure occurs.  I discussed that engaging in this telemedicine visit, they consent to the provision of behavioral healthcare and the services will be billed under their insurance.  Patient and/or legal guardian expressed understanding and consented to Telemedicine visit: {YES/NO:21197}  Presenting Concerns: Patient and/or family reports the following symptoms/concerns: *** Duration of problem: ***; Severity of problem: {Mild/Moderate/Severe:20260}  Patient and/or Family's Strengths/Protective Factors: {CHL AMB BH PROTECTIVE FACTORS:[id number]}  Goals Addressed: Patient will: 1.  Reduce symptoms of: {IBH Symptoms:21014056}  2.  Increase knowledge and/or ability of: {IBH Patient Tools:21014057}  3.  Demonstrate ability to: {IBH Goals:21014053}  Progress towards Goals: {CHL AMB BH PROGRESS TOWARDS GOALS:2405042401}  Interventions: Interventions utilized:  {IBH Interventions:21014054} Standardized Assessments completed: {IBH Screening  Tools:21014051}  Patient and/or Family Response: ***  Assessment: Patient currently experiencing ***.   Patient may benefit from ***.  Plan: 1. Follow up with behavioral health clinician on : *** 2. Behavioral recommendations: *** 3. Referral(s): {IBH Referrals:21014055}  I discussed the assessment and treatment plan with the patient and/or parent/guardian. They were provided an opportunity to ask questions and all were answered. They agreed with the plan and demonstrated an understanding of the instructions.   They were advised to call back or seek an in-person evaluation if the symptoms worsen or if the condition fails to improve as anticipated.  Bonnie Mccoy Bonnie Glassberg, LCSW

## 2020-01-30 NOTE — ED Notes (Signed)
Off floor to CT 
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
## 2020-01-30 NOTE — ED Notes (Signed)
Social Worker (Left Message) called @ [phone]-per Dr. Rhunette Croft called by Marylene Land

## 2020-02-02 ENCOUNTER — Ambulatory Visit: Payer: Self-pay | Admitting: Obstetrics and Gynecology

## 2020-02-05 ENCOUNTER — Ambulatory Visit: Payer: Medicaid Other

## 2020-02-16 ENCOUNTER — Ambulatory Visit (HOSPITAL_COMMUNITY): Payer: Medicaid Other | Admitting: Clinical

## 2020-02-16 ENCOUNTER — Telehealth (HOSPITAL_COMMUNITY): Payer: Self-pay | Admitting: Clinical

## 2020-02-16 ENCOUNTER — Other Ambulatory Visit: Payer: Self-pay

## 2020-02-16 NOTE — Telephone Encounter (Signed)
Therapist sent the client a text message link via MyChart for the scheduled virtual visit. Client did not respond to the link. Therapist attempted phone call to the clients cell phone. Client did not answer. Therapist left a voicemail instructing the client to call the office to reschedule the appointment.

## 2021-06-11 IMAGING — US US MFM OB DETAIL+14 WK
1 series · 15 of 28 positions shown · non-contrast
Comparison: none

[Series 1: us mfm ob detail+14 wk · 147 acquisitions, 15 frames shown]
[im 1/147]
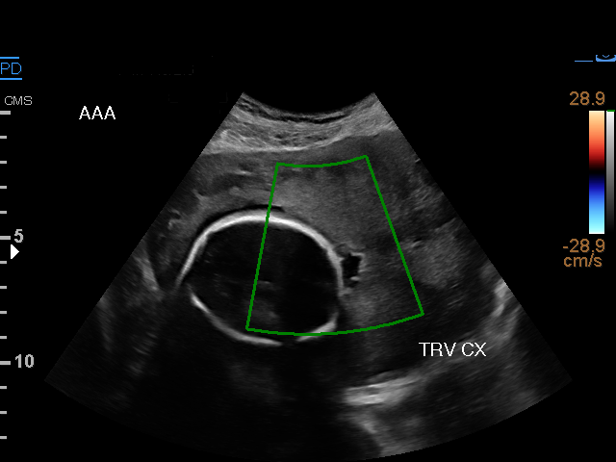
[im 11/147]
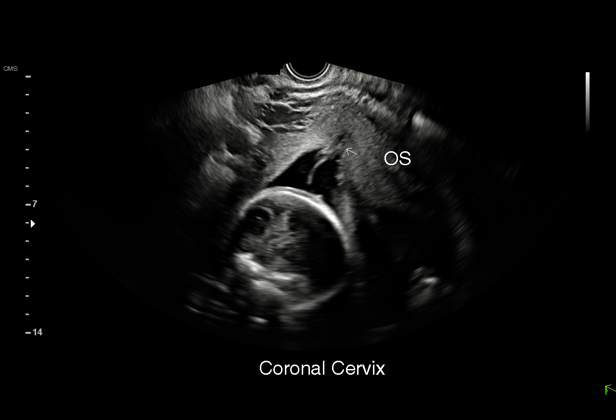
[im 22/147]
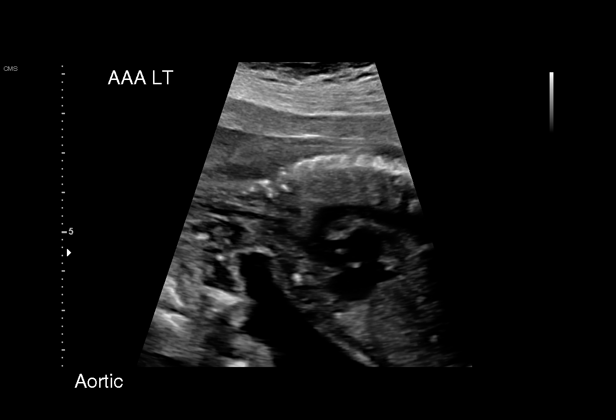
[im 33/147]
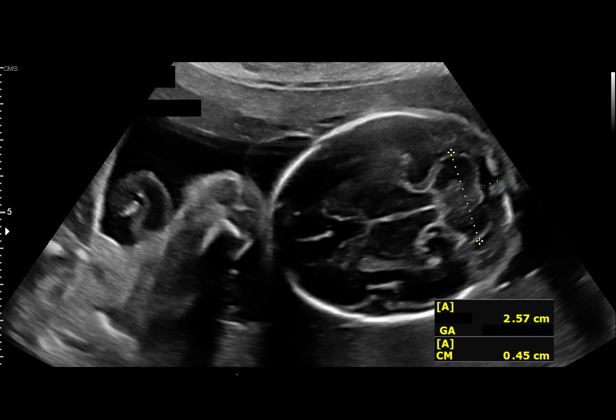
[im 44/147]
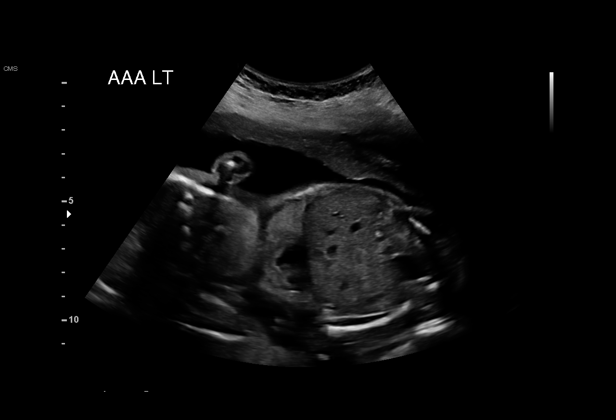
[im 55/147]
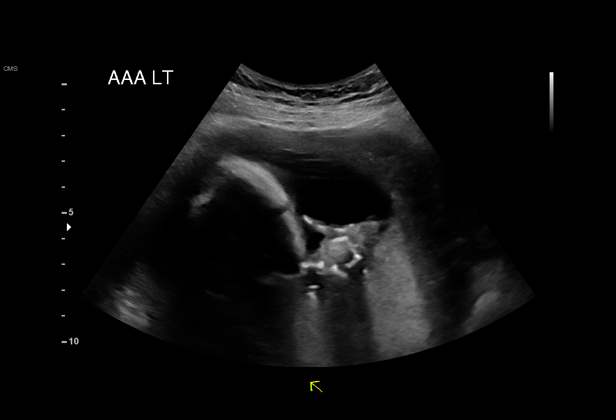
[im 65/147]
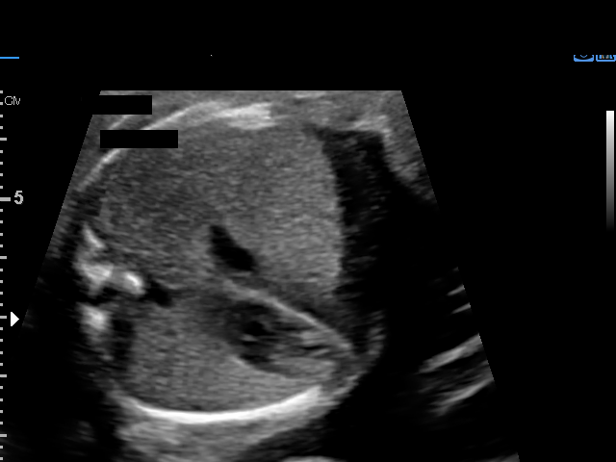
[im 76/147]
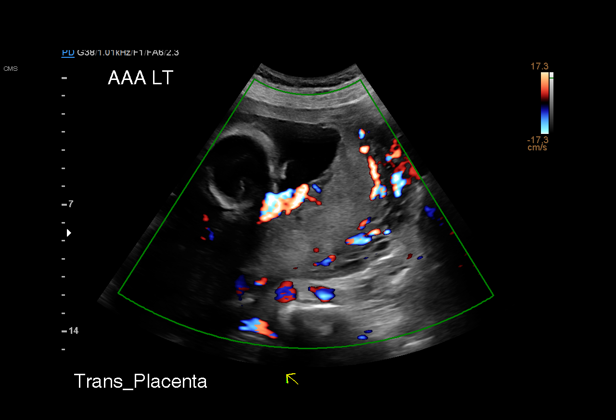
[im 82/147]
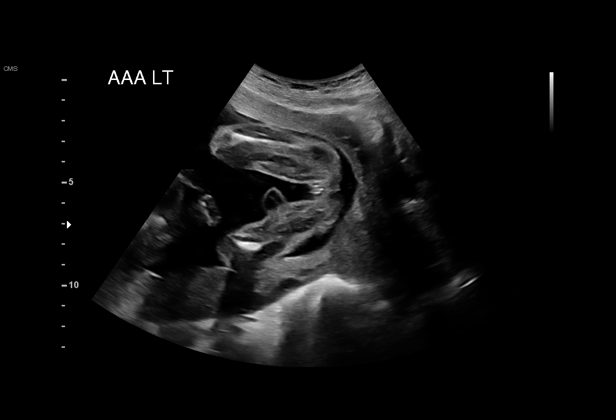
[im 92/147]
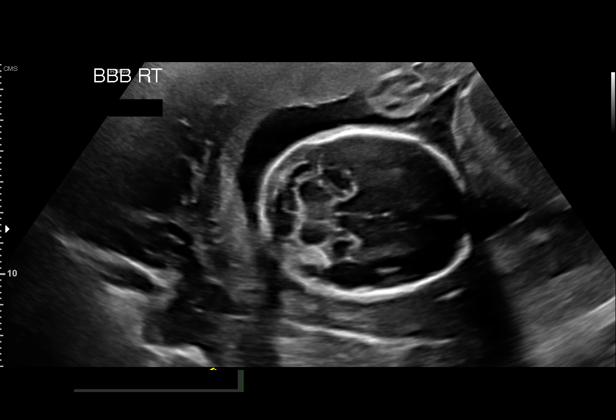
[im 103/147]
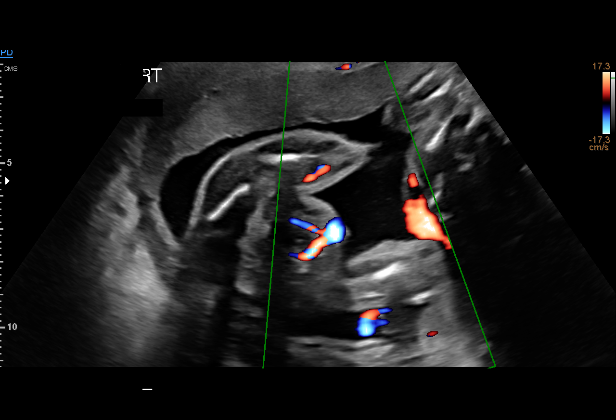
[im 114/147]
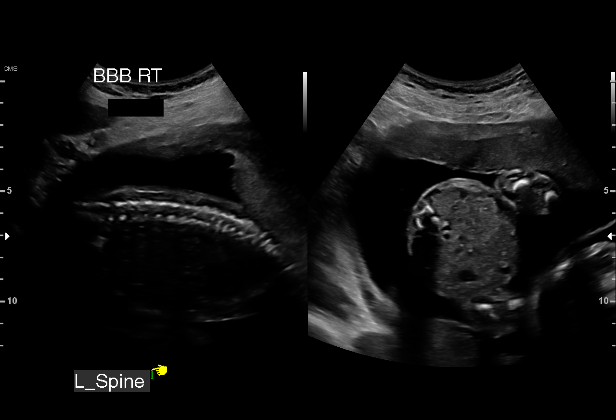
[im 125/147]
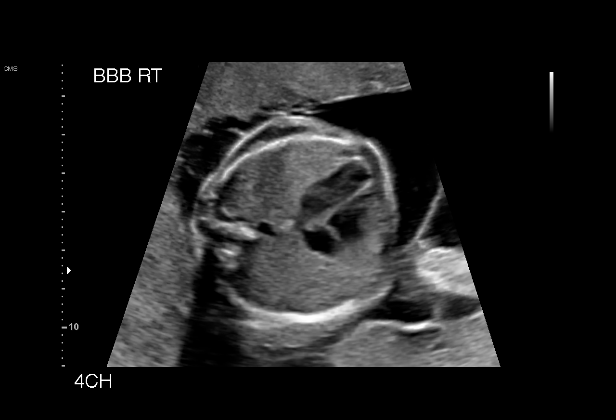
[im 136/147]
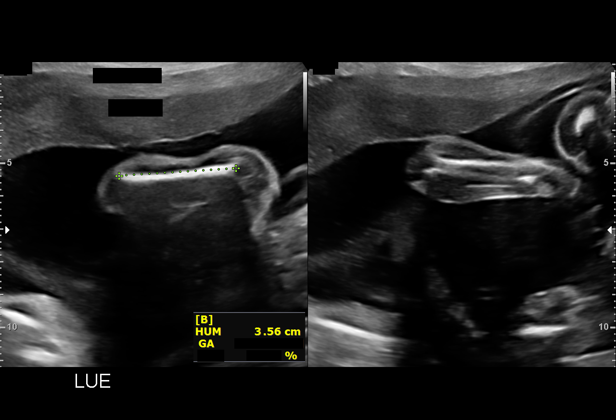
[im 147/147]
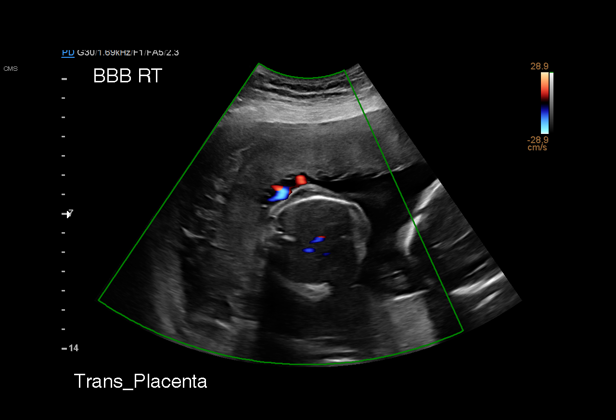

[15 of 28 positions shown; findings below may reference images not displayed]

2  US MFM OB DETAIL ADDL GEST            76811.02    K[facility]ILA BANDO
    +14 WK
 3  US MFM OB TRANSVAGINAL                76817.2     KAPILA BANDO

Indications

 Antenatal screening for malformations
 Twin pregnancy, di/di, second trimester
 (NIPS pending)
 Advanced maternal age multigravida 35,
 second trimester
 Late prenatal care, second trimester
 Smoking complicating pregnancy, second
 trimester
 23 weeks gestation of pregnancy
Fetal Evaluation (Fetus A)

 Num Of Fetuses:          2
 Fetal Heart Rate(bpm):   152
 Cardiac Activity:        Observed
 Fetal Lie:               Maternal left side
 Presentation:            Breech
 Placenta:                Anterior Left
 P. Cord Insertion:       Visualized
 Membrane Desc:      Dividing Membrane seen
 Amniotic Fluid
 AFI FV:      Within normal limits

                             Largest Pocket(cm)
                             4.
Biometry (Fetus A)

 BPD:        57  mm     G. Age:  23w 3d         50  %    CI:        73.53   %    70 - 86
                                                         FL/HC:       20.3  %    19.2 -
 HC:      211.2  mm     G. Age:  23w 1d         31  %    HC/AC:       1.11       1.05 -
 AC:      189.7  mm     G. Age:  23w 5d         56  %    FL/BPD:      75.1  %    71 - 87
 FL:       42.8  mm     G. Age:  24w 0d         62  %    FL/AC:       22.6  %    20 - 24
 HUM:      39.1  mm     G. Age:  24w 0d         56  %
 CER:      25.7  mm     G. Age:  23w 2d         73  %
 LV:        5.3  mm
 CM:        4.5  mm
 Est. FW:     626   gm     1 lb 6 oz     66  %     FW Discordancy     0 \ 12 %
OB History

 Blood Type:   O+
 Gravidity:    9         Term:   3        Prem:   1        SAB:   1
 TOP:          3       Ectopic:  0        Living: 4
Gestational Age (Fetus A)

 LMP:           23w 2d        Date:  06/04/19                 EDD:   03/10/20
 U/S Today:     23w 4d                                        EDD:   03/08/20
 Best:          23w 2d     Det. By:  LMP  (06/04/19)          EDD:   03/10/20
Anatomy (Fetus A)

 Cranium:               Appears normal         LVOT:                   Appears normal
 Cavum:                 Appears normal         Aortic Arch:            Appears normal
 Ventricles:            Appears normal         Ductal Arch:            Appears normal
 Choroid Plexus:        Appears normal         Diaphragm:              Appears normal
 Cerebellum:            Appears normal         Stomach:                Appears normal, left
                                                                       sided
 Posterior Fossa:       Appears normal         Abdomen:                Appears normal
 Nuchal Fold:           Not applicable (>20    Abdominal Wall:         Appears nml (cord
                        wks GA)                                        insert, abd wall)
 Face:                  Orbits nl; profile not Cord Vessels:           Appears normal (3
                        well visualized                                vessel cord)
 Lips:                  Appears normal         Kidneys:                Appear normal
 Palate:                Not well visualized    Bladder:                Appears normal
 Thoracic:              Appears normal         Spine:                  Appears normal
 Heart:                 Not well visualized    Upper Extremities:      Appears normal
 RVOT:                  Appears normal         Lower Extremities:      Visualized

 Other:  VC, 3VV and 3VTV visualized. Fetus appears to be female. Left open
         hand/5th digit visualized.
Fetal Evaluation (Fetus B)
 Num Of Fetuses:          2
 Fetal Heart Rate(bpm):   140
 Cardiac Activity:        Observed
 Fetal Lie:               Maternal right side
 Presentation:            Cephalic
 Placenta:                Anterior
 P. Cord Insertion:       Visualized
 Membrane Desc:      Dividing Membrane seen

 Amniotic Fluid
 AFI FV:      Within normal limits

                             Largest Pocket(cm)
                             4.
Biometry (Fetus B)

 BPD:      53.2  mm     G. Age:  22w 1d         10  %    CI:        67.81   %    70 - 86
                                                         FL/HC:       18.3  %    19.2 -
 HC:      206.7  mm     G. Age:  22w 5d         17  %    HC/AC:       1.09       1.05 -
 AC:      189.5  mm     G. Age:  23w 5d         55  %    FL/BPD:      71.2  %    71 - 87
 FL:       37.9  mm     G. Age:  22w 1d          9  %    FL/AC:       20.0  %    20 - 24
 HUM:      37.2  mm     G. Age:  23w 0d         37  %
 CER:      24.5  mm     G. Age:  22w 3d         49  %

 LV:        4.3  mm
 CM:        4.1  mm

 Est. FW:     548   gm     1 lb 3 oz     26  %     FW Discordancy        12  %
Gestational Age (Fetus B)

 LMP:           23w 2d        Date:  06/04/19                 EDD:   03/10/20
 U/S Today:     22w 5d                                        EDD:   03/14/20
 Best:          23w 2d     Det. By:  LMP  (06/04/19)          EDD:   03/10/20
Anatomy (Fetus B)

 Cranium:               Appears normal         LVOT:                   Appears normal
 Cavum:                 Appears normal         Aortic Arch:            Appears normal
 Ventricles:            Appears normal         Ductal Arch:            Appears normal
 Choroid Plexus:        Appears normal         Diaphragm:              Appears normal
 Cerebellum:            Appears normal         Stomach:                Appears normal, left
                                                                       sided
 Posterior Fossa:       Appears normal         Abdomen:                Appears normal
 Nuchal Fold:           Not applicable (>20    Abdominal Wall:         Not well visualized
                        wks GA)
 Face:                  Orbits nl; profile not Cord Vessels:           Appears normal (3
                        well visualized                                vessel cord)
 Lips:                  Appears normal         Kidneys:                Appear normal
 Palate:                Not well visualized    Bladder:                Appears normal
 Thoracic:              Appears normal         Spine:                  Appears normal
 Heart:                 Not well visualized    Upper Extremities:      Appears normal
 RVOT:                  Not well visualized    Lower Extremities:      Appears normal

 Other:  Feet visualized. Right heel visualized. Fetus appears to be female.
Cervix Uterus Adnexa
 Cervix
 Length:           4.31  cm.
 Normal appearance by transvaginal scan

 Uterus
 No abnormality visualized.

 Right Ovary
 Not visualized.

 Left Ovary
 Not visualized.

 Cul De Sac
 No free fluid seen.

 Adnexa
 No abnormality visualized.
Impression

 Twin intrauterine pregnancy with features suggestive of
 Diamniotic Dichorionic pregnancy.
 Normal anatomy with good amniotic fluid and fetal movement
 was observed in Twin A and B.

 The potential risks associated with a twin gestation were
 discussed.  This discussion included a review of the
 increased risk of miscarriages, anomalies, preterm labor,
 and/or delivery, malpresentation, delivery via cesarean
 section, gestational diabetes, and/or preeclampsia.  With
 regards to fetal risks, there is an increased risk for fetal
 growth restriction of one or both twins, preterm labor, and
 associated morbidity, and intrauterine fetal demise.

 We recommend growth scans every 4 weeks starting at 24
 weeks with the initation of weekly antenatal testing in the
 form of twice weekly NST or weekly BPP should abnormal
 fetal growth or intertwin discordance of greater than 20-25%
 is noted.

 Suboptimal anatomy was observed in both twin A and B as
 documented above.

 Lastly, we recommend initiating daily low dose aspirin

 Genetic screening pending.
Recommendations

 Limited exam scheduled in 2 weeks to confirm chorionicity
 Follow up growth in 4 weeks to complete fetal anatomy

## 2021-07-08 IMAGING — US US MFM OB FOLLOW-UP
1 series · 16 of 28 positions shown · non-contrast
Comparison: none

[Series 1: us mfm ob follow-up · 83 acquisitions, 16 frames shown]
[im 1/83]
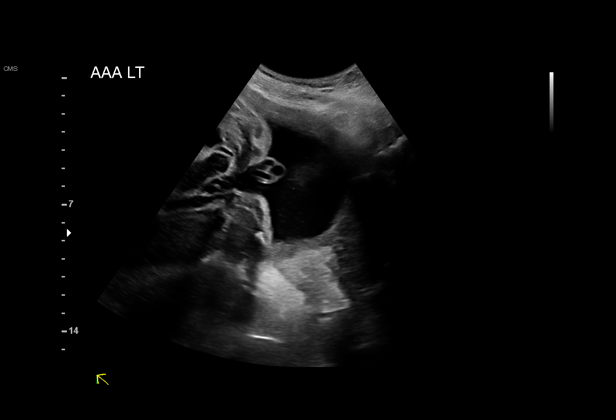
[im 7/83]
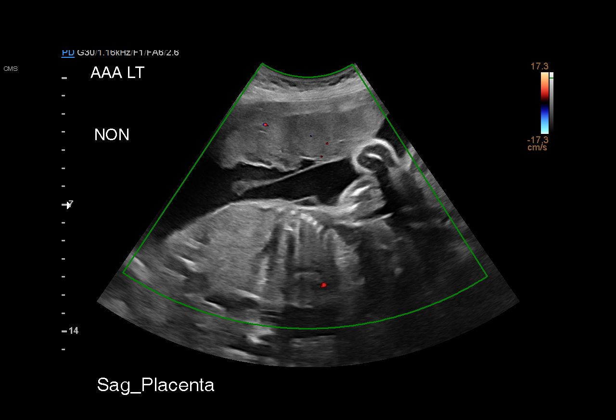
[im 13/83]
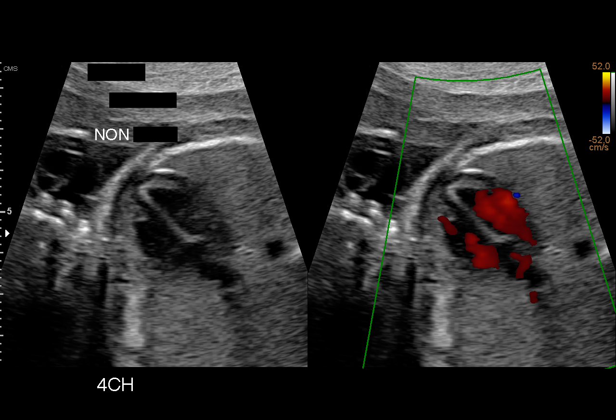
[im 19/83]
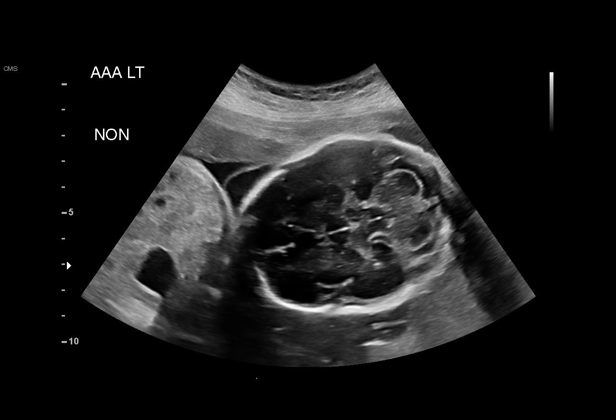
[im 22/83]
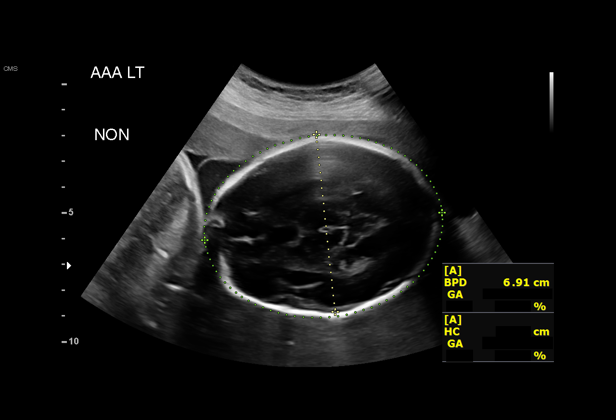
[im 28/83]
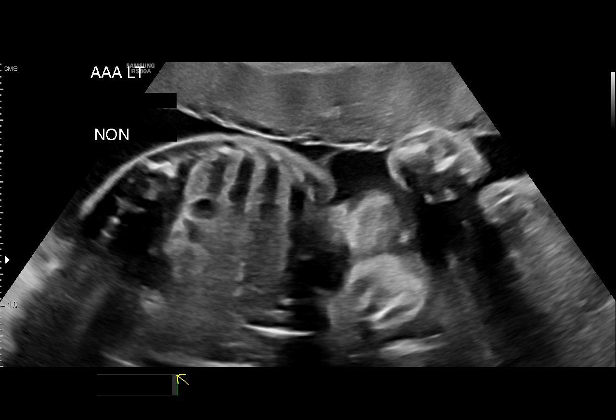
[im 34/83]
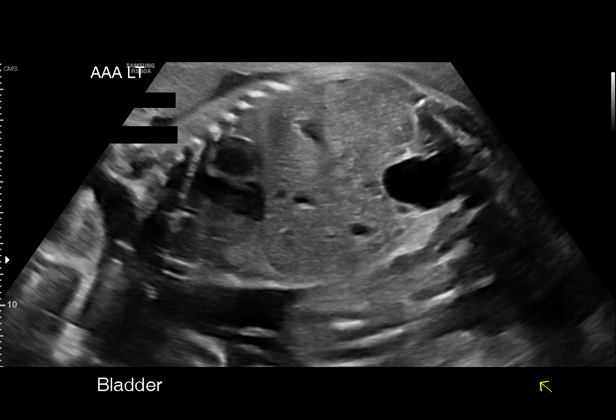
[im 40/83]
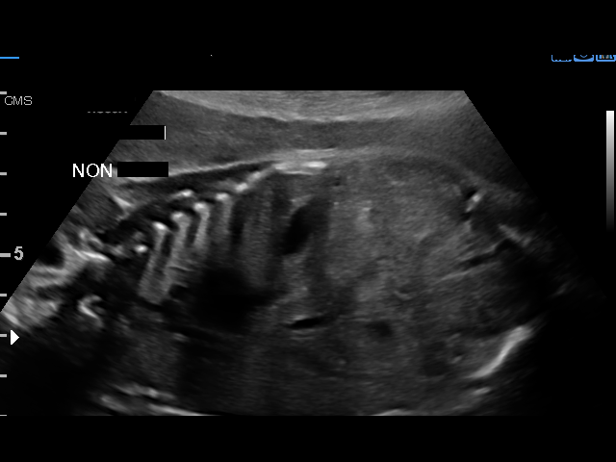
[im 43/83]
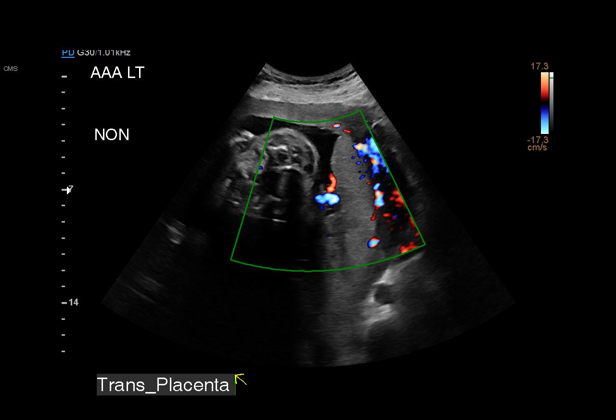
[im 49/83]
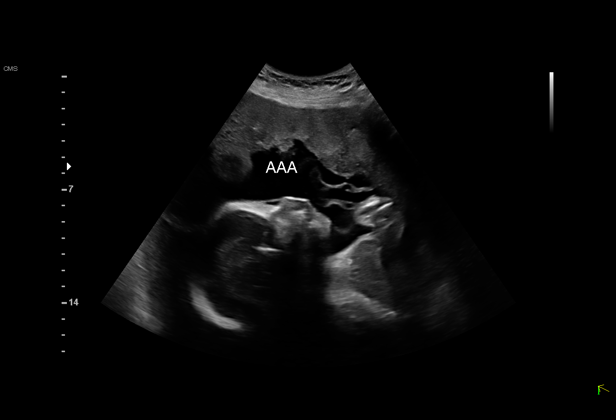
[im 55/83]
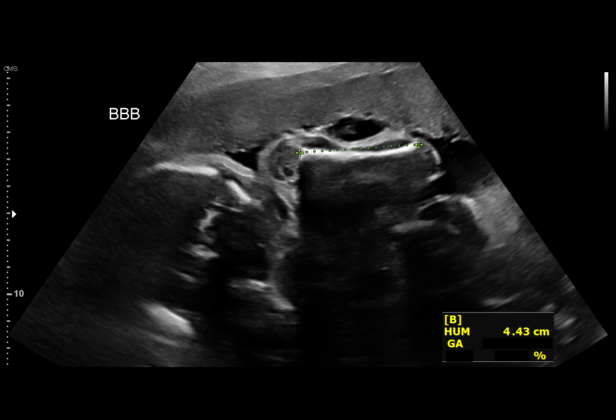
[im 61/83]
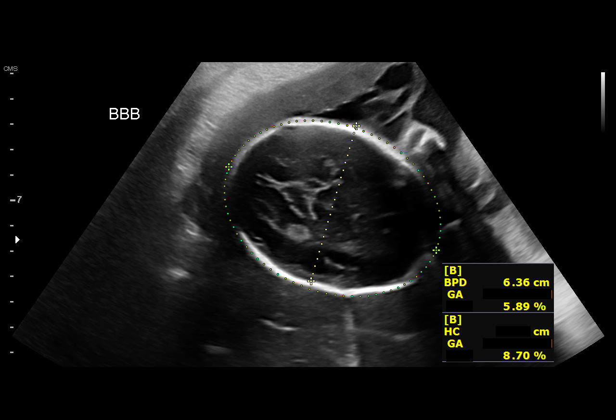
[im 64/83]
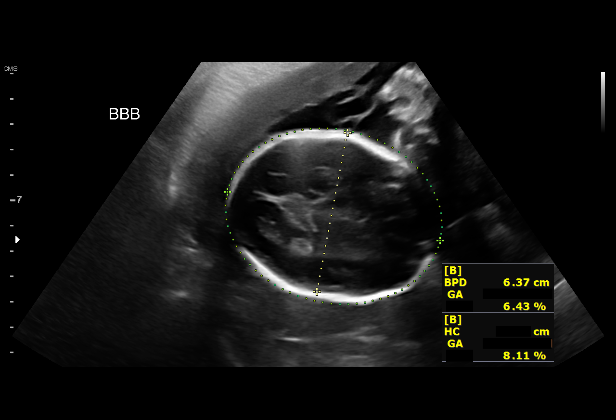
[im 70/83]
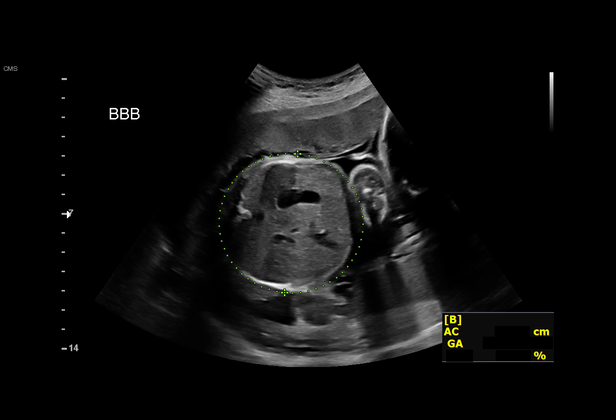
[im 76/83]
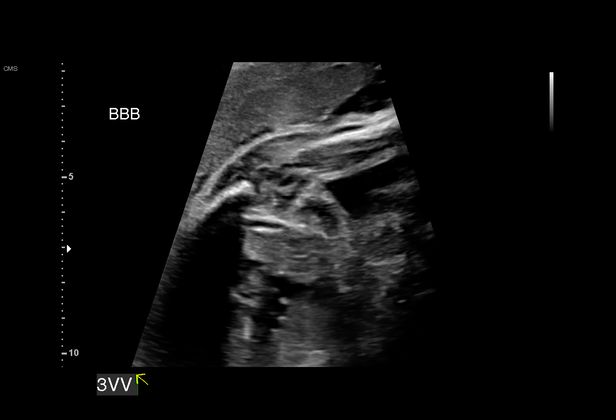
[im 83/83]
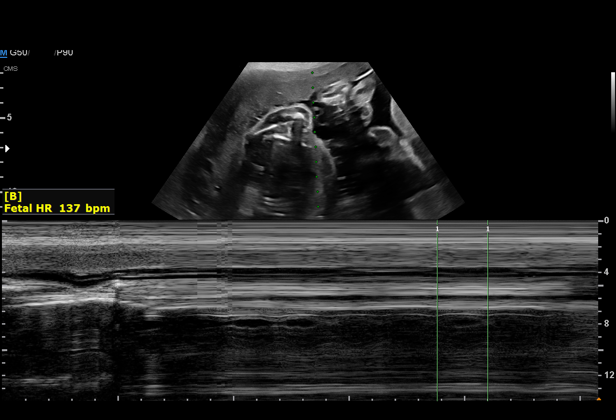

[16 of 28 positions shown; findings below may reference images not displayed]

GEST

Indications

 27 weeks gestation of pregnancy
 Antenatal screening for malformations
 Twin pregnancy, [place]acko/[place], second trimester
 Advanced maternal age multigravida 33,
 second trimester
 Late prenatal care, second trimester
 Smoking complicating pregnancy, second
 trimester
 Kornchanok/Parisi Muscular Dystrophy
 Carrier
 Anemia during pregnancy in second trimester
Fetal Evaluation (Fetus A)

 Num Of Fetuses:         2
 Fetal Heart Rate(bpm):  129
 Cardiac Activity:       Observed
 Fetal Lie:              Maternal left side
 Presentation:           Breech
 Placenta:               Left
 P. Cord Insertion:      Previously Visualized
 Membrane Desc:      Dividing Membrane seen
 Amniotic Fluid
 AFI FV:      Within normal limits

                             Largest Pocket(cm)

Biometry (Fetus A)

 BPD:        69  mm     G. Age:  27w 5d         59  %    CI:        74.42   %    70 - 86
                                                         FL/HC:      20.3   %    18.6 -
 HC:      253.9  mm     G. Age:  27w 4d         35  %    HC/AC:      1.14        1.05 -
 AC:      222.1  mm     G. Age:  26w 4d         27  %    FL/BPD:     74.8   %    71 - 87
 FL:       51.6  mm     G. Age:  27w 4d         48  %    FL/AC:      23.2   %    20 - 24
 HUM:      45.7  mm     G. Age:  27w 0d         43  %

 LV:        3.8  mm

 Est. FW:    6939  gm      2 lb 4 oz     37  %     FW Discordancy         2  %
OB History

 Blood Type:   O+
 Gravidity:    9         Term:   3        Prem:   1        SAB:   1
 TOP:          3       Ectopic:  0        Living: 4
Gestational Age (Fetus A)

 LMP:           27w 1d        Date:  06/04/19                 EDD:   03/10/20
 U/S Today:     27w 3d                                        EDD:   03/08/20
 Best:          27w 1d     Det. By:  LMP  (06/04/19)          EDD:   03/10/20
Anatomy (Fetus A)

 Cranium:               Appears normal         LVOT:                   Previously seen
 Cavum:                 Appears normal         Aortic Arch:            Previously seen
 Ventricles:            Appears normal         Ductal Arch:            Previously seen
 Choroid Plexus:        Appears normal         Diaphragm:              Appears normal
 Cerebellum:            Appears normal         Stomach:                Appears normal, left
                                                                       sided
 Posterior Fossa:       Appears normal         Abdomen:                Appears normal
 Nuchal Fold:           Not applicable (>20    Abdominal Wall:         Previously seen
                        wks GA)
 Face:                  Appears normal         Cord Vessels:           Previously seen
                        (orbits and profile)
 Lips:                  Appears normal         Kidneys:                Appear normal
 Palate:                Not well visualized    Bladder:                Appears normal
 Thoracic:              Appears normal         Spine:                  Previously seen
 Heart:                 Appears normal         Upper Extremities:      Previously seen
                        (4CH, axis, and
                        situs)
 RVOT:                  Previously seen        Lower Extremities:      Previously seen

 Other:  VC, 3VV and 3VTV previously visualized. Female gender previously
         seen. Left open hand/5th digit previously visualized.
Fetal Evaluation (Fetus B)
 Num Of Fetuses:         2
 Fetal Heart Rate(bpm):  137
 Cardiac Activity:       Observed
 Fetal Lie:              Maternal right side
 Presentation:           Cephalic
 Placenta:               Anterior
 P. Cord Insertion:      Previously Visualized
 Membrane Desc:      Dividing Membrane seen

 Amniotic Fluid
 AFI FV:      Within normal limits

                             Largest Pocket(cm)

Biometry (Fetus B)

 BPD:      63.5  mm     G. Age:  25w 5d          6  %    CI:        68.52   %    70 - 86
                                                         FL/HC:      20.7   %    18.6 -
 HC:      245.2  mm     G. Age:  26w 4d         11  %    HC/AC:      1.06        1.05 -
 AC:      232.2  mm     G. Age:  27w 4d         55  %    FL/BPD:     80.0   %    71 - 87
 FL:       50.8  mm     G. Age:  27w 2d         38  %    FL/AC:      21.9   %    20 - 24
 HUM:      43.7  mm     G. Age:  26w 0d         21  %
 LV:        2.9  mm

 Est. FW:    0381  gm      2 lb 5 oz     41  %     FW Discordancy      0 \ 2 %
Gestational Age (Fetus B)

 LMP:           27w 1d        Date:  06/04/19                 EDD:   03/10/20
 U/S Today:     26w 6d                                        EDD:   03/12/20
 Best:          27w 1d     Det. By:  LMP  (06/04/19)          EDD:   03/10/20
Anatomy (Fetus B)

 Cranium:               Appears normal         LVOT:                   Previously seen
 Cavum:                 Appears normal         Aortic Arch:            Previously seen
 Ventricles:            Appears normal         Ductal Arch:            Previously seen
 Choroid Plexus:        Appears normal         Diaphragm:              Previously seen
 Cerebellum:            Appears normal         Stomach:                Appears normal, left
                                                                       sided
 Posterior Fossa:       Appears normal         Abdomen:                Appears normal
 Nuchal Fold:           Not applicable (>20    Abdominal Wall:         Not well visualized
                        wks GA)
 Face:                  Orbits prev vis;       Cord Vessels:           Appears normal (3
                        profile not well vis                           vessel cord)
 Lips:                  Appears normal         Kidneys:                Appear normal
 Palate:                Not well visualized    Bladder:                Appears normal
 Thoracic:              Appears normal         Spine:                  Previously seen
 Heart:                 Appears normal         Upper Extremities:      Previously seen
                        (4CH, axis, and
                        situs)
 RVOT:                  Appears normal         Lower Extremities:      Previously seen

 Other:  Feet previously visualized. Right heel previously visualized. Female
         gender previously seen.
Cervix Uterus Adnexa
 Cervix
 Length:           3.16  cm.
 Normal appearance by transabdominal scan.

 Uterus
 No abnormality visualized.

 Right Ovary
 Not visualized.

 Left Ovary
 Not visualized.

 Cul De Sac
 No free fluid seen.

 Adnexa
 No abnormality visualized.
Impression

 Follow up growth due for diamniotic dichorionic twin
 pregnancies.
 Normal interval growth with measurements consistent with
 dates
 Good fetal movement and amniotic fluid volume
 There is normal stomach and bladders noted in both twins.
 Twin growth discordane is 2%.
 Suboptimal views of the fetal anatomy was again obtained
 secondary to fetal position and multiple gestation.

 DMD genetic counseling was previously provided by [facility]
 genetic counselors. She was offered to meet with Mau Garces.
 However, she had no further questions.
Recommendations

 Follow up growth in 4 weeks.
